# Patient Record
Sex: Female | Born: 1970 | Race: White | Hispanic: No | Marital: Married | Smoking: Never smoker
Health system: Southern US, Community
[De-identification: ages and names within clinical notes are randomized; demographics above are authoritative.]

## PROBLEM LIST (undated history)

## (undated) DIAGNOSIS — K219 Gastro-esophageal reflux disease without esophagitis: Secondary | ICD-10-CM

---

## 1998-12-07 ENCOUNTER — Other Ambulatory Visit: Admission: RE | Admit: 1998-12-07 | Discharge: 1998-12-07 | Payer: Self-pay | Admitting: Obstetrics and Gynecology

## 2000-02-25 ENCOUNTER — Other Ambulatory Visit: Admission: RE | Admit: 2000-02-25 | Discharge: 2000-02-25 | Payer: Self-pay | Admitting: Obstetrics and Gynecology

## 2001-05-14 ENCOUNTER — Other Ambulatory Visit: Admission: RE | Admit: 2001-05-14 | Discharge: 2001-05-14 | Payer: Self-pay | Admitting: Obstetrics and Gynecology

## 2002-07-06 ENCOUNTER — Other Ambulatory Visit: Admission: RE | Admit: 2002-07-06 | Discharge: 2002-07-06 | Payer: Self-pay | Admitting: Obstetrics and Gynecology

## 2003-08-16 ENCOUNTER — Other Ambulatory Visit: Admission: RE | Admit: 2003-08-16 | Discharge: 2003-08-16 | Payer: Self-pay | Admitting: Obstetrics and Gynecology

## 2003-08-22 ENCOUNTER — Encounter: Admission: RE | Admit: 2003-08-22 | Discharge: 2003-08-22 | Payer: Self-pay | Admitting: Obstetrics and Gynecology

## 2003-08-30 ENCOUNTER — Ambulatory Visit (HOSPITAL_BASED_OUTPATIENT_CLINIC_OR_DEPARTMENT_OTHER): Admission: RE | Admit: 2003-08-30 | Discharge: 2003-08-30 | Payer: Self-pay | Admitting: Urology

## 2004-10-26 ENCOUNTER — Ambulatory Visit: Payer: Self-pay | Admitting: Family Medicine

## 2007-03-18 ENCOUNTER — Ambulatory Visit (HOSPITAL_BASED_OUTPATIENT_CLINIC_OR_DEPARTMENT_OTHER): Admission: RE | Admit: 2007-03-18 | Discharge: 2007-03-18 | Payer: Self-pay | Admitting: Obstetrics and Gynecology

## 2008-03-01 ENCOUNTER — Ambulatory Visit: Payer: Self-pay | Admitting: Internal Medicine

## 2009-02-20 ENCOUNTER — Emergency Department: Payer: Self-pay | Admitting: Emergency Medicine

## 2010-02-09 LAB — HM MAMMOGRAPHY: HM Mammogram: ABNORMAL

## 2010-11-29 ENCOUNTER — Ambulatory Visit: Payer: Self-pay | Admitting: Obstetrics and Gynecology

## 2010-12-05 ENCOUNTER — Ambulatory Visit: Payer: Self-pay | Admitting: Obstetrics

## 2011-06-12 LAB — HM PAP SMEAR: HM Pap smear: NORMAL

## 2011-07-23 ENCOUNTER — Other Ambulatory Visit: Payer: Self-pay | Admitting: Internal Medicine

## 2011-07-24 MED ORDER — PANTOPRAZOLE SODIUM 40 MG PO TBEC: 40.0000 mg | DELAYED_RELEASE_TABLET | Freq: Every day | ORAL | Status: DC

## 2011-10-28 ENCOUNTER — Other Ambulatory Visit: Payer: Self-pay | Admitting: *Deleted

## 2011-10-28 NOTE — Telephone Encounter (Signed)
Faxed request from [facility], last filled 09/04/11.

## 2011-10-29 MED ORDER — CITALOPRAM HYDROBROMIDE 20 MG PO TABS: 20.0000 mg | ORAL_TABLET | Freq: Every day | ORAL | Status: DC

## 2011-12-19 ENCOUNTER — Other Ambulatory Visit: Payer: Self-pay | Admitting: Internal Medicine

## 2011-12-19 MED ORDER — PANTOPRAZOLE SODIUM 40 MG PO TBEC: 40.0000 mg | DELAYED_RELEASE_TABLET | Freq: Every day | ORAL | Status: DC

## 2012-11-11 ENCOUNTER — Ambulatory Visit (INDEPENDENT_AMBULATORY_CARE_PROVIDER_SITE_OTHER): Payer: BC Managed Care – PPO | Admitting: Internal Medicine

## 2012-11-11 ENCOUNTER — Encounter: Payer: Self-pay | Admitting: Internal Medicine

## 2012-11-11 VITALS — BP 120/86 | HR 81 | Temp 97.9°F | Resp 16 | Ht 67.0 in | Wt 235.0 lb

## 2012-11-11 DIAGNOSIS — E669 Obesity, unspecified: Secondary | ICD-10-CM

## 2012-11-11 DIAGNOSIS — R5381 Other malaise: Secondary | ICD-10-CM

## 2012-11-11 DIAGNOSIS — Z1239 Encounter for other screening for malignant neoplasm of breast: Secondary | ICD-10-CM

## 2012-11-11 DIAGNOSIS — R5383 Other fatigue: Secondary | ICD-10-CM

## 2012-11-11 DIAGNOSIS — B354 Tinea corporis: Secondary | ICD-10-CM | POA: Insufficient documentation

## 2012-11-11 DIAGNOSIS — Z1231 Encounter for screening mammogram for malignant neoplasm of breast: Secondary | ICD-10-CM

## 2012-11-11 MED ORDER — CITALOPRAM HYDROBROMIDE 10 MG PO TABS: 10.0000 mg | ORAL_TABLET | Freq: Every day | ORAL | Status: DC

## 2012-11-11 MED ORDER — PANTOPRAZOLE SODIUM 40 MG PO TBEC: 40.0000 mg | DELAYED_RELEASE_TABLET | Freq: Every day | ORAL | Status: DC

## 2012-11-11 NOTE — Assessment & Plan Note (Addendum)
Suggested by the recent appearance of 2 discrete annular lesion: Left thigh and left lower back.  No response to topicals  Used for 2 weeks.  Will check lfts and if normal will prescribe topical nystatin since she has already tried OTC topical terbinafine.

## 2012-11-11 NOTE — Patient Instructions (Addendum)
Schedule an early morning  fasting labs visit with the secretary  You need to lose 10%  Of your current body weight over the next 6 months   This is  my version of a  "Low GI"  Diet:  It is not ultra low carb, but will still lower your blood sugars and allow you to lose 5 to 10 lbs per month if you follow it carefully. All of the foods can be found at grocery stores and in bulk at [employer].  The Atkins protein bars and shakes are available in more varieties at [employer], [employer] and [employer].     7 AM Breakfast:  Low carbohydrate Protein  Shakes (I recommend the EAS AdvantEdge "Carb Control" shakes  Or the low carb shakes by Atkins.   Both are available everywhere:  In  cases at [employer]Js  Or in 4 packs at grocery stores and pharmacies  2.5 carbs  (Alternative is  a toasted Arnold's Sandwhich Thin w/ peanut butter, a "Bagel Thin" with cream cheese and salmon) or  a scrambled egg burrito made with a low carb tortilla .  Avoid cereal and bananas, oatmeal too unless you are cooking the old fashioned kind that takes 30-40 minutes to prepare.  the rest is overly processed, has minimal fiber, and is loaded with carbohydrates!   10 AM: Protein bar by Atkins (the snack size, under 200 cal).  There are many varieties , available widely again or in bulk in limited varieties at BJs)  Other so called "protein bars" tend to be loaded with carbohydrates.  Remember, in food advertising, the word "energy" is synonymous for " carbohydrate."  Lunch: sandwich of [place], (or any lunchmeat, grilled meat or canned tuna), fresh avocado, mayonnaise  and cheese on a lower carbohydrate pita bread, flatbread, or tortilla . Ok to use regular mayonnaise. The bread is the only source or carbohydrate that can be decreased (Joseph's makes a pita bread and a flat bread that are 50 cal and 4 net carbs ; Toufayan makes a low carb flatbread that's 100 cal and 9 net carbs  and  Mission makes a low carb whole wheat tortilla  That is 210 cal and  6 net carbs)  3 PM:  Mid day :  Another protein bar,  Or a  cheese stick (100 cal, 0 carbs),  Or 1 ounce of  almonds, walnuts, pistachios, pecans, peanuts,  Macadamia nuts. Or a Dannon light n Fit greek yogurt, 80 cal 8 net carbs . Avoid "granola"; the dried cranberries and raisins are loaded with carbohydrates. Mixed nuts ok if no raisins or cranberries or dried fruit.      6 PM  Dinner:  "mean and green:"  Meat/chicken/fish or a high protein legume; , with a green salad, and a low GI  Veggie (broccoli, cauliflower, green beans, spinach, brussel sprouts. Lima beans) : Avoid "Low fat dressings, as well as Reyne Dumas and [address]! They are loaded with sugar! Instead use ranch, vinagrette,  Blue cheese, etc.  There is a low carb pasta by Dreamfield's available at [facility] that is acceptable and tastes great. Try Michel Angel's chicken piccata over low carb pasta. The chicken dish is 0 carbs, and can be found in frozen section at BJs and [employer]'s. Also try [employer] "Carnitas" (pulled pork, no sauce,  0 carbs) and his pot roast.   both are in the refrigerated section at BJs   Dreamfield's makes a low carb pasta only 5  g/serving.  Available at all grocery stores,  And tastes like normal pasta  9 PM snack : Breyer's "low carb" fudgsicle or  ice cream bar (Carb Smart line), or  Weight Watcher's ice cream bar , or another "no sugar added" ice cream;a serving of fresh berries/cherries with whipped cream (Avoid bananas, pineapple, grapes  and watermelon on a regular basis because they are high in sugar)   Remember that snack Substitutions should be less than 10 carbs per serving and meals < 20 carbs. Remember to subtract fiber grams and sugar alcohols to get the "net carbs."

## 2012-11-11 NOTE — Progress Notes (Signed)
Patient ID: Kristin Davis, female   DOB: [birth year], 42 y.o.   MRN: [id number] Patient Active Problem List  Diagnosis  . Ringworm of body  . Obesity (BMI 30-39.9)    Subjective:  CC:   Chief Complaint  Patient presents with  . Annual Exam    HPI:   Kristin Davis is a 42 y.o. female who presents to establish primary care with the chief complaint of  Loss to followup.   She was last seen two years ago at my previous clinic,  Records are available. She has infrequent flares ups of GERD controlled with regular use of probiotics and prn H2 blockers. She has a history of GAD with panic attacks with no recent episodes.  Anxiety is controlled with celexa 10  Mg daily.  She has gained 24 lbs since she was last seen (July 2012) and is addressing her weight gain by ging to the gym several times per week at [employer]:    Cardio daily,  And circuit every other day .     History reviewed. No pertinent past medical history.  History reviewed. No pertinent past surgical history.  Family History  Problem Relation Age of Onset  . Heart attack Father   . Cancer Maternal Grandmother     ovarian    History   Social History  . Marital Status: Married    Spouse Name: N/A    Number of Children: N/A  . Years of Education: N/A   Occupational History  . Not on file.   Social History Main Topics  . Smoking status: Never Smoker   . Smokeless tobacco: Not on file  . Alcohol Use: No  . Drug Use: No  . Sexually Active:    Other Topics Concern  . Not on file   Social History Narrative  . No narrative on file   No Known Allergies   Review of Systems:  Patient denies headache, fevers, malaise, unintentional weight loss, skin rash, eye pain, sinus congestion and sinus pain, sore throat, dysphagia,  hemoptysis , cough, dyspnea, wheezing, chest pain, palpitations, orthopnea, edema, abdominal pain, nausea, melena, diarrhea, constipation, flank pain, dysuria, hematuria, urinary  Frequency,  nocturia, numbness, tingling, seizures,  Focal weakness, Loss of consciousness,  Tremor, insomnia, depression, anxiety, and suicidal ideation.    Objective:  BP 120/86  Pulse 81  Temp 97.9 F (36.6 C) (Oral)  Resp 16  Ht 5\' 7"  (1.702 m)  Wt 235 lb (106.595 kg)  BMI 36.81 kg/m2  SpO2 97%  LMP 10/28/2012  General appearance: alert, cooperative and appears stated age Ears: normal TM's and external ear canals both ears Throat: lips, mucosa, and tongue normal; teeth and gums normal Neck: no adenopathy, no carotid bruit, supple, symmetrical, trachea midline and thyroid not enlarged, symmetric, no tenderness/mass/nodules Back: symmetric, no curvature. ROM normal. No CVA tenderness. Lungs: clear to auscultation bilaterally Heart: regular rate and rhythm, S1, S2 normal, no murmur, click, rub or gallop Abdomen: soft, non-tender; bowel sounds normal; no masses,  no organomegaly Pulses: 2+ and symmetric Skin: 2 discrete annular papular lesions, erythematous and scaling.  Inner left thing and left lower back  Lymph nodes: Cervical, supraclavicular, and axillary nodes normal.  Assessment and Plan:  Ringworm of body Suggested by the recent appearance of 2 discrete annular lesion: Left thigh and left lower back.  No response to topicals  Used for 2 weeks.  Will check lfts and if normal will prescribe topical nystatin since she has already tried OTC topical  terbinafine.   Obesity (BMI 30-39.9) I have addressed  BMI and recommended a low glycemic index diet utilizing smaller more frequent meals to increase metabolism.  I have also recommended that patient start exercising with a goal of 30 minutes of aerobic exercise a minimum of 5 days per week. Screening for lipid disorders, thyroid and diabetes was done  today.   A total of 60 minutes was spent reviewing patient's history, medical records, and in exam.  Over 50% of which was face to face time.   Updated Medication List Outpatient Encounter  Prescriptions as of 11/11/2012  Medication Sig Dispense Refill  . citalopram (CELEXA) 10 MG tablet Take 1 tablet (10 mg total) by mouth daily.  90 tablet  3  . pantoprazole (PROTONIX) 40 MG tablet Take 1 tablet (40 mg total) by mouth daily.  90 tablet  3  . [DISCONTINUED] citalopram (CELEXA) 20 MG tablet Take 10 mg by mouth daily.       . [DISCONTINUED] pantoprazole (PROTONIX) 40 MG tablet Take 1 tablet (40 mg total) by mouth daily.  30 tablet  3  . nystatin cream (MYCOSTATIN) Apply topically 2 (two) times daily.  30 g  0  . [DISCONTINUED] citalopram (CELEXA) 20 MG tablet Take 10 mg by mouth daily.         Orders Placed This Encounter  Procedures  . HM MAMMOGRAPHY  . MM Digital Screening  . HM PAP SMEAR  . CBC with Differential  . Comprehensive metabolic panel  . TSH  . Lipid panel    No Follow-up on file.

## 2012-11-12 ENCOUNTER — Other Ambulatory Visit (INDEPENDENT_AMBULATORY_CARE_PROVIDER_SITE_OTHER): Payer: BC Managed Care – PPO

## 2012-11-12 DIAGNOSIS — R5383 Other fatigue: Secondary | ICD-10-CM

## 2012-11-12 DIAGNOSIS — E669 Obesity, unspecified: Secondary | ICD-10-CM

## 2012-11-12 DIAGNOSIS — R5381 Other malaise: Secondary | ICD-10-CM

## 2012-11-12 LAB — CBC WITH DIFFERENTIAL/PLATELET
Basophils Absolute: 0 10*3/uL (ref 0.0–0.1)
Basophils Relative: 0.7 % (ref 0.0–3.0)
Eosinophils Absolute: 0.1 10*3/uL (ref 0.0–0.7)
Eosinophils Relative: 1.1 % (ref 0.0–5.0)
HCT: 38 % (ref 36.0–46.0)
Hemoglobin: 12.9 g/dL (ref 12.0–15.0)
Lymphocytes Relative: 31.1 % (ref 12.0–46.0)
Lymphs Abs: 1.6 10*3/uL (ref 0.7–4.0)
MCHC: 34.1 g/dL (ref 30.0–36.0)
MCV: 87.8 fl (ref 78.0–100.0)
Monocytes Absolute: 0.6 10*3/uL (ref 0.1–1.0)
Monocytes Relative: 12 % (ref 3.0–12.0)
Neutro Abs: 2.9 10*3/uL (ref 1.4–7.7)
Neutrophils Relative %: 55.1 % (ref 43.0–77.0)
Platelets: 200 10*3/uL (ref 150.0–400.0)
RBC: 4.33 Mil/uL (ref 3.87–5.11)
RDW: 14.6 % (ref 11.5–14.6)
WBC: 5.2 10*3/uL (ref 4.5–10.5)

## 2012-11-12 LAB — COMPREHENSIVE METABOLIC PANEL
ALT: 29 U/L (ref 0–35)
AST: 27 U/L (ref 0–37)
Albumin: 3.8 g/dL (ref 3.5–5.2)
Alkaline Phosphatase: 48 U/L (ref 39–117)
BUN: 13 mg/dL (ref 6–23)
CO2: 22 mEq/L (ref 19–32)
Calcium: 8.8 mg/dL (ref 8.4–10.5)
Chloride: 107 mEq/L (ref 96–112)
Creatinine, Ser: 0.7 mg/dL (ref 0.4–1.2)
GFR: 96.71 mL/min (ref >60.00)
Glucose, Bld: 97 mg/dL (ref 70–99)
Potassium: 3.9 mEq/L (ref 3.5–5.1)
Sodium: 136 mEq/L (ref 135–145)
Total Bilirubin: 0.6 mg/dL (ref 0.3–1.2)
Total Protein: 7 g/dL (ref 6.0–8.3)

## 2012-11-12 LAB — LDL CHOLESTEROL, DIRECT: Direct LDL: 141.8 mg/dL

## 2012-11-12 LAB — LIPID PANEL
Cholesterol: 211 mg/dL — ABNORMAL HIGH (ref 0–200)
HDL: 35.2 mg/dL — ABNORMAL LOW (ref >39.00)
Total CHOL/HDL Ratio: 6
Triglycerides: 153 mg/dL — ABNORMAL HIGH (ref 0.0–149.0)
VLDL: 30.6 mg/dL (ref 0.0–40.0)

## 2012-11-12 LAB — TSH: TSH: 2.55 u[IU]/mL (ref 0.35–5.50)

## 2012-11-13 DIAGNOSIS — E669 Obesity, unspecified: Secondary | ICD-10-CM | POA: Insufficient documentation

## 2012-11-13 MED ORDER — NYSTATIN 100000 UNIT/GM EX CREA: TOPICAL_CREAM | Freq: Two times a day (BID) | CUTANEOUS | Status: DC

## 2012-11-13 NOTE — Assessment & Plan Note (Signed)
I have addressed  BMI and recommended a low glycemic index diet utilizing smaller more frequent meals to increase metabolism.  I have also recommended that patient start exercising with a goal of 30 minutes of aerobic exercise a minimum of 5 days per week. Screening for lipid disorders, thyroid and diabetes was  done today.   

## 2012-11-16 ENCOUNTER — Other Ambulatory Visit: Payer: BC Managed Care – PPO

## 2012-11-23 ENCOUNTER — Encounter: Payer: Self-pay | Admitting: Internal Medicine

## 2012-11-23 DIAGNOSIS — D229 Melanocytic nevi, unspecified: Secondary | ICD-10-CM

## 2012-11-23 HISTORY — DX: Melanocytic nevi, unspecified: D22.9

## 2013-09-21 ENCOUNTER — Ambulatory Visit
Admission: RE | Admit: 2013-09-21 | Discharge: 2013-09-21 | Disposition: A | Discharge status: Home / Self Care | Payer: BC Managed Care – PPO | Source: Ambulatory Visit | Attending: Internal Medicine | Admitting: Internal Medicine

## 2013-09-21 DIAGNOSIS — Z1231 Encounter for screening mammogram for malignant neoplasm of breast: Secondary | ICD-10-CM

## 2013-09-23 ENCOUNTER — Other Ambulatory Visit: Payer: Self-pay | Admitting: Internal Medicine

## 2013-09-23 DIAGNOSIS — R928 Other abnormal and inconclusive findings on diagnostic imaging of breast: Secondary | ICD-10-CM

## 2013-09-29 ENCOUNTER — Ambulatory Visit
Admission: RE | Admit: 2013-09-29 | Discharge: 2013-09-29 | Disposition: A | Discharge status: Home / Self Care | Payer: BC Managed Care – PPO | Source: Ambulatory Visit | Attending: Internal Medicine | Admitting: Internal Medicine

## 2013-09-29 ENCOUNTER — Other Ambulatory Visit: Payer: Self-pay | Admitting: Internal Medicine

## 2013-09-29 DIAGNOSIS — R928 Other abnormal and inconclusive findings on diagnostic imaging of breast: Secondary | ICD-10-CM

## 2013-09-30 ENCOUNTER — Ambulatory Visit
Admission: RE | Admit: 2013-09-30 | Discharge: 2013-09-30 | Disposition: A | Discharge status: Home / Self Care | Payer: BC Managed Care – PPO | Source: Ambulatory Visit | Attending: Internal Medicine | Admitting: Internal Medicine

## 2013-09-30 ENCOUNTER — Other Ambulatory Visit: Payer: Self-pay | Admitting: Internal Medicine

## 2013-09-30 DIAGNOSIS — R928 Other abnormal and inconclusive findings on diagnostic imaging of breast: Secondary | ICD-10-CM

## 2013-09-30 DIAGNOSIS — Z87898 Personal history of other specified conditions: Secondary | ICD-10-CM

## 2013-09-30 HISTORY — PX: BREAST BIOPSY: SHX20

## 2013-10-03 DIAGNOSIS — Z87898 Personal history of other specified conditions: Secondary | ICD-10-CM | POA: Insufficient documentation

## 2013-10-21 HISTORY — PX: ABDOMINAL HYSTERECTOMY: SHX81

## 2013-12-22 ENCOUNTER — Ambulatory Visit: Payer: Self-pay | Admitting: Obstetrics and Gynecology

## 2013-12-22 LAB — CBC
HCT: 32.8 % — ABNORMAL LOW (ref 35.0–47.0)
HGB: 10.4 g/dL — ABNORMAL LOW (ref 12.0–16.0)
MCH: 27.4 pg (ref 26.0–34.0)
MCHC: 31.7 g/dL — ABNORMAL LOW (ref 32.0–36.0)
MCV: 87 fL (ref 80–100)
Platelet: 274 10*3/uL (ref 150–440)
RBC: 3.8 10*6/uL (ref 3.80–5.20)
RDW: 14.5 % (ref 11.5–14.5)
WBC: 7.3 10*3/uL (ref 3.6–11.0)

## 2013-12-22 LAB — PREGNANCY, URINE: Pregnancy Test, Urine: NEGATIVE m[IU]/mL

## 2013-12-27 ENCOUNTER — Ambulatory Visit: Payer: Self-pay | Admitting: Obstetrics and Gynecology

## 2013-12-28 LAB — HEMOGLOBIN: HGB: 8.2 g/dL — ABNORMAL LOW (ref 12.0–16.0)

## 2013-12-29 LAB — PATHOLOGY REPORT

## 2014-04-12 ENCOUNTER — Encounter: Payer: Self-pay | Admitting: Internal Medicine

## 2014-04-12 ENCOUNTER — Ambulatory Visit (INDEPENDENT_AMBULATORY_CARE_PROVIDER_SITE_OTHER): Payer: BC Managed Care – PPO | Admitting: Internal Medicine

## 2014-04-12 VITALS — BP 112/74 | HR 84 | Temp 99.0°F | Resp 18 | Ht 67.0 in | Wt 231.5 lb

## 2014-04-12 DIAGNOSIS — R0683 Snoring: Secondary | ICD-10-CM

## 2014-04-12 DIAGNOSIS — E669 Obesity, unspecified: Secondary | ICD-10-CM

## 2014-04-12 DIAGNOSIS — R635 Abnormal weight gain: Secondary | ICD-10-CM

## 2014-04-12 DIAGNOSIS — M546 Pain in thoracic spine: Secondary | ICD-10-CM

## 2014-04-12 DIAGNOSIS — R0609 Other forms of dyspnea: Secondary | ICD-10-CM

## 2014-04-12 DIAGNOSIS — R0989 Other specified symptoms and signs involving the circulatory and respiratory systems: Secondary | ICD-10-CM

## 2014-04-12 MED ORDER — ZOLPIDEM TARTRATE ER 6.25 MG PO TBCR
6.2500 mg | EXTENDED_RELEASE_TABLET | Freq: Every evening | ORAL | Status: DC | PRN
Start: 2014-04-12 — End: 2014-08-17

## 2014-04-12 NOTE — Patient Instructions (Signed)
You have no evidence of an aortic aneurysm by review of your prior abdominal CT in 2004.  Without a history fo hypertension and tobacco abuse, it is VERY unlikely that you would have developed one since then  Your back pain may actually be coming from a gallbladder issue. If the pain happens after eating,  We will order an evaluation of your GB  Sleep study to be scheduled.  Take the ambien cr with you (2 tablets, just in case)

## 2014-04-12 NOTE — Progress Notes (Signed)
Patient ID: Kristin Davis, female   DOB: 09/30/1971, 43 y.o.   MRN: [id number]  Patient Active Problem List   Diagnosis Date Noted  . Back pain, thoracic 04/14/2014  . History of abnormal mammogram 10/03/2013  . Obesity (BMI 30-39.9) 11/13/2012  . Ringworm of body 11/11/2012    Subjective:  CC:   Chief Complaint  Patient presents with  . Back Pain    between shoulders/ patient concerned of family HX of aortic aneurysm    HPI:   Kristin Davis is a 43 y.o. female who presents for Recurrent Chest and back pain for about 6 months. Location is between the shoulder blades. . Above bra 2-3 inches.  Episodic ,  Feels like she was struck in the back.  Father was recently diagnosed with an abdominal aortic aneurysm.   Takes prilosec otc prn gerd., prior use of protonix daily stopped.   Had her hysterectomy /BSO by [facility] for menorrhagia,  Family history of ovarian Ca   having trouble staying asleep about once a week.  History of  snoring    History reviewed. No pertinent past medical history.  Past Surgical History  Procedure Laterality Date  . Abdominal hysterectomy  2015       The following portions of the patient's history were reviewed and updated as appropriate: Allergies, current medications, and problem list.    Review of Systems:   Patient denies headache, fevers, malaise, unintentional weight loss, skin rash, eye pain, sinus congestion and sinus pain, sore throat, dysphagia,  hemoptysis , cough, dyspnea, wheezing, chest pain, palpitations, orthopnea, edema, abdominal pain, nausea, melena, diarrhea, constipation, flank pain, dysuria, hematuria, urinary  Frequency, nocturia, numbness, tingling, seizures,  Focal weakness, Loss of consciousness,  Tremor, insomnia, depression, anxiety, and suicidal ideation.     History   Social History  . Marital Status: Married    Spouse Name: N/A    Number of Children: N/A  . Years of Education: N/A   Occupational History   . Not on file.   Social History Main Topics  . Smoking status: Never Smoker   . Smokeless tobacco: Not on file  . Alcohol Use: No  . Drug Use: No  . Sexual Activity:    Other Topics Concern  . Not on file   Social History Narrative  . No narrative on file    Objective:  Filed Vitals:   04/12/14 1614  BP: 112/74  Pulse: 84  Temp: 99 F (37.2 C)  Resp: 18     General appearance: alert, cooperative and appears stated age Ears: normal TM's and external ear canals both ears Throat: lips, mucosa, and tongue normal; teeth and gums normal Neck: no adenopathy, no carotid bruit, supple, symmetrical, trachea midline and thyroid not enlarged, symmetric, no tenderness/mass/nodules Back: symmetric, no curvature. ROM normal. No CVA tenderness. Lungs: clear to auscultation bilaterally Heart: regular rate and rhythm, S1, S2 normal, no murmur, click, rub or gallop Abdomen: soft, non-tender; bowel sounds normal; no masses,  no organomegaly Pulses: 2+ and symmetric Skin: Skin color, texture, turgor normal. No rashes or lesions Lymph nodes: Cervical, supraclavicular, and axillary nodes normal.  Assessment and Plan:  Obesity (BMI 30-39.9) I have addressed  BMI and recommended a low glycemic index diet utilizing smaller more frequent meals to increase metabolism.  I have also recommended that patient start exercising with a goal of 30 minutes of aerobic exercise a minimum of 5 days per week. Screening for lipid disorders, thyroid and diabetes to be  done today.    Back pain, thoracic Suggested GB disease as a cause of her recurrent back pain, not an aneursym. Prior CT from 5 yrs ago reviewed. Advised to consider an ultrasound  Primary snoring Obesity, snoring and insomnia.  Sleep study ordered.   Updated Medication List Outpatient Encounter Prescriptions as of 04/12/2014  Medication Sig  . nystatin cream (MYCOSTATIN) Apply topically 2 (two) times daily.  . pantoprazole (PROTONIX)  40 MG tablet Take 1 tablet (40 mg total) by mouth daily.  Marland Kitchen zolpidem (AMBIEN CR) 6.25 MG CR tablet Take 1 tablet (6.25 mg total) by mouth at bedtime as needed for sleep.  . [DISCONTINUED] citalopram (CELEXA) 10 MG tablet Take 1 tablet (10 mg total) by mouth daily.

## 2014-04-12 NOTE — Progress Notes (Signed)
Pre-visit discussion using our clinic review tool. No additional management support is needed unless otherwise documented below in the visit note.  

## 2014-04-13 LAB — COMPREHENSIVE METABOLIC PANEL
ALT: 31 U/L (ref 0–35)
AST: 29 U/L (ref 0–37)
Albumin: 4.2 g/dL (ref 3.5–5.2)
Alkaline Phosphatase: 68 U/L (ref 39–117)
BUN: 16 mg/dL (ref 6–23)
CO2: 24 mEq/L (ref 19–32)
Calcium: 9 mg/dL (ref 8.4–10.5)
Chloride: 107 mEq/L (ref 96–112)
Creatinine, Ser: 0.9 mg/dL (ref 0.4–1.2)
GFR: 68.38 mL/min (ref >60.00)
Glucose, Bld: 85 mg/dL (ref 70–99)
Potassium: 4.1 mEq/L (ref 3.5–5.1)
Sodium: 139 mEq/L (ref 135–145)
Total Bilirubin: 0.3 mg/dL (ref 0.2–1.2)
Total Protein: 7.4 g/dL (ref 6.0–8.3)

## 2014-04-13 LAB — TSH: TSH: 0.32 u[IU]/mL — ABNORMAL LOW (ref 0.35–4.50)

## 2014-04-14 ENCOUNTER — Encounter: Payer: Self-pay | Admitting: Internal Medicine

## 2014-04-14 ENCOUNTER — Ambulatory Visit: Payer: BC Managed Care – PPO

## 2014-04-14 DIAGNOSIS — R7309 Other abnormal glucose: Secondary | ICD-10-CM

## 2014-04-14 DIAGNOSIS — M546 Pain in thoracic spine: Secondary | ICD-10-CM | POA: Insufficient documentation

## 2014-04-14 DIAGNOSIS — R0683 Snoring: Secondary | ICD-10-CM | POA: Insufficient documentation

## 2014-04-14 LAB — T4, FREE: Free T4: 1.02 ng/dL (ref 0.60–1.60)

## 2014-04-14 NOTE — Assessment & Plan Note (Signed)
Suggested GB disease as a cause of her recurrent back pain, not an aneursym. Prior CT from 5 yrs ago reviewed. Advised to consider an ultrasound

## 2014-04-14 NOTE — Assessment & Plan Note (Signed)
Obesity, snoring and insomnia.  Sleep study ordered.

## 2014-04-14 NOTE — Assessment & Plan Note (Signed)
I have addressed  BMI and recommended a low glycemic index diet utilizing smaller more frequent meals to increase metabolism.  I have also recommended that patient start exercising with a goal of 30 minutes of aerobic exercise a minimum of 5 days per week. Screening for lipid disorders, thyroid and diabetes to be done today.   

## 2014-05-27 ENCOUNTER — Telehealth: Payer: Self-pay | Admitting: *Deleted

## 2014-05-27 DIAGNOSIS — E039 Hypothyroidism, unspecified: Secondary | ICD-10-CM

## 2014-05-27 NOTE — Telephone Encounter (Signed)
Pt is coming in on Monday what labs and dx?

## 2014-05-30 ENCOUNTER — Other Ambulatory Visit (INDEPENDENT_AMBULATORY_CARE_PROVIDER_SITE_OTHER): Payer: BC Managed Care – PPO

## 2014-05-30 DIAGNOSIS — E0590 Thyrotoxicosis, unspecified without thyrotoxic crisis or storm: Secondary | ICD-10-CM

## 2014-05-30 DIAGNOSIS — E039 Hypothyroidism, unspecified: Secondary | ICD-10-CM

## 2014-05-31 DIAGNOSIS — E0590 Thyrotoxicosis, unspecified without thyrotoxic crisis or storm: Secondary | ICD-10-CM | POA: Insufficient documentation

## 2014-05-31 LAB — T4 AND TSH
T4, Total: 11.1 ug/dL (ref 4.5–12.0)
TSH: 0.009 u[IU]/mL — ABNORMAL LOW (ref 0.450–4.500)

## 2014-05-31 NOTE — Addendum Note (Signed)
Addended by: Crecencio Mc on: 05/31/2014 04:57 PM   Modules accepted: Orders

## 2014-06-08 NOTE — Addendum Note (Signed)
Addended by: Crecencio Mc on: 06/08/2014 01:12 PM   Modules accepted: Orders

## 2014-06-09 ENCOUNTER — Ambulatory Visit: Payer: BC Managed Care – PPO | Admitting: Endocrinology

## 2014-06-14 ENCOUNTER — Encounter: Payer: Self-pay | Admitting: Endocrinology

## 2014-06-14 ENCOUNTER — Ambulatory Visit (INDEPENDENT_AMBULATORY_CARE_PROVIDER_SITE_OTHER): Payer: BC Managed Care – PPO | Admitting: Endocrinology

## 2014-06-14 VITALS — BP 118/78 | HR 66 | Temp 98.3°F | Resp 16 | Ht 67.0 in | Wt 216.2 lb

## 2014-06-14 DIAGNOSIS — E0590 Thyrotoxicosis, unspecified without thyrotoxic crisis or storm: Secondary | ICD-10-CM

## 2014-06-14 DIAGNOSIS — E041 Nontoxic single thyroid nodule: Secondary | ICD-10-CM

## 2014-06-14 LAB — HEPATIC FUNCTION PANEL
ALT: 35 U/L (ref 0–35)
AST: 29 U/L (ref 0–37)
Albumin: 3.8 g/dL (ref 3.5–5.2)
Alkaline Phosphatase: 42 U/L (ref 39–117)
Bilirubin, Direct: 0.1 mg/dL (ref 0.0–0.3)
Total Bilirubin: 0.5 mg/dL (ref 0.2–1.2)
Total Protein: 7.2 g/dL (ref 6.0–8.3)

## 2014-06-14 LAB — T3, FREE: T3, Free: 2.3 pg/mL (ref 2.3–4.2)

## 2014-06-14 LAB — T4, FREE: Free T4: 0.56 ng/dL — ABNORMAL LOW (ref 0.60–1.60)

## 2014-06-14 NOTE — Progress Notes (Signed)
Reason for visit: Low TSH/hyperthyroidism HPI  Kristin Davis is a 43 y.o.-year-old female, referred by her PCP,  Deborra Medina, MD, for evaluation for hyperthyroidism/low TSH.   The patient denies any prior personal history of thyroid problems.  Mom has hypothyroidism. she denies any personal XRT exposure to her neck area. No recent hospitalizations or illness. Did have a couple of episodes of Sharp pain in neck and thyroid area the week prior, but denies any soreness that is persistent. Has been stressed lately as daughter is off to [employer].  Denies any recent ingestion of seaweed/kelp or recent exposure to iv contrast dye. No recent steroid use or use of herbal supplements.  Had a hysterectomy for menorrhagia in march 2015. History of ovarian cancer in family, so had bilateral ovarianectomy as well. Not on hormones.  Reports being symptomatic 1 month after her surgery (~ April 2015) with hair loss, On and off hot flashes and fatigue. Reports being anemic at time of discharge. Now that HB is better, her energy levels are better but has above symptoms- is considering starting HRT.  Started back on Lifestyle changes this Summer. Lost 20 lbs since July through [employer] and exercise. Hasn't lost any more since past 2 weeks. Has been more hungry lately.   Also, wonders if her facial hair is related to her thyroid. Has noted increased facial hair since puberty. Was tested several years ago for PCOS, but testing came back okay. Asked at that time to start BCPs, which she didn't want to pursue. Now shaving daily for facial hair. Reports that cortisol testing at that time with 24 hour urine was mildy abnormal. Periods were regular prior to recent uterine surgery. No breast discharge. Has been having prominent facial hair since puberty. No recent steroids or abnormal stretch marks or easy bruising.      I reviewed pt's thyroid tests: Lab Results  Component Value Date   TSH 0.009* 05/30/2014   TSH 0.32* 04/12/2014   TSH 2.55 11/12/2012   FREET4 1.02 04/14/2014    Other pertinent labs are as follows: Lab Results  Component Value Date   AST 29 04/12/2014   Lab Results  Component Value Date   ALT 31 04/12/2014    Review of systems:  [ x ] complains of   [  ] denies  [ x ] weight loss [  ]  tremors [  ] palpitations  [  ] diarrhea [  ] increased anxiety [  ] muscle weakness  [ x ] heat intolerance [ x ] fatigue  2weeks ago and then better [  ] proptosis [  ] problems with eye closure or color vision ( had symptoms of dry right eye at night a few weeks ago, but this is better). [  ] noticing any enlargement in size of thyroid [  ] lumps in neck [  ] dysphagia  [  ] change in voice [  ]  SOB when lying down   I have reviewed the patient's past medical history, family and social history, surgical history, medications and allergies.  No past medical history on file. Past Surgical History  Procedure Laterality Date  . Abdominal hysterectomy  2015   History   Social History  . Marital Status: Married    Spouse Name: N/A    Number of Children: N/A  . Years of Education: N/A   Occupational History  . Not on file.   Social History Main Topics  . Smoking  status: Never Smoker   . Smokeless tobacco: Not on file  . Alcohol Use: No  . Drug Use: No  . Sexual Activity: Not on file   Other Topics Concern  . Not on file   Social History Narrative  . No narrative on file   Current Outpatient Prescriptions on File Prior to Visit  Medication Sig Dispense Refill  . nystatin cream (MYCOSTATIN) Apply topically 2 (two) times daily.  30 g  0  . pantoprazole (PROTONIX) 40 MG tablet Take 1 tablet (40 mg total) by mouth daily.  90 tablet  3  . zolpidem (AMBIEN CR) 6.25 MG CR tablet Take 1 tablet (6.25 mg total) by mouth at bedtime as needed for sleep.  30 tablet  0   No current facility-administered medications on file prior to visit.   No Known Allergies Family History  Problem  Relation Age of Onset  . Heart attack Father   . Alcohol abuse Father   . Aortic aneurysm Father   . Cancer Maternal Grandmother     ovarian  . Thyroid disease Mother      ROS: Review of Systems: [x]  complains of  [  ] denies General:   [ x ] Recent weight change [x  ] Fatigue  [  ] Loss of appetite Eyes: [  ]  Vision Difficulty [ x ]  Eye pain ENT: [  ]  Hearing difficulty [ x ]  Difficulty Swallowing CVS: [  ] Chest pain [  ]  Palpitations/Irregular Heart beat [  ]  Shortness of breath lying flat [  ] Swelling of legs Resp: [  ] Frequent Cough [  ] Shortness of Breath  [  ]  Wheezing GI: [  ] Heartburn  [  ] Nausea or Vomiting  [  ] Diarrhea [  ] Constipation  [  ] Abdominal Pain GU: [  ]  Polyuria  [  ]  nocturia Bones/joints:  [  ]  Muscle aches  [  ] Joint Pain  [  ] Bone pain Skin/Hair/Nails: [  ]  Rash  [  ] New stretch marks [ x ]  Itching [ x ] Hair loss [ x ]  Excessive hair growth Reproduction: [x  ] Low sexual desire , [  ]  Women: Menstrual cycle problems [  ]  Women: Breast Discharge [  ] Men: Difficulty with erections [  ]  Men: Enlarged Breasts CNS: [  ] Frequent Headaches [  ] Blurry vision [  ] Tremors [  ] Seizures [  ] Loss of consciousness [  ] Localized weakness Endocrine: [  ]  Excess thirst [ x ]  Feeling excessively hot [  ]  Feeling excessively cold Heme: [  ]  Easy bruising [  ]  Enlarged glands or lumps in neck Allergy: [  ]  Food allergies [ x ] Environmental allergies  PE: BP 118/78  Pulse 66  Temp(Src) 98.3 F (36.8 C) (Oral)  Resp 16  Ht 5\' 7"  (1.702 m)  Wt 216 lb 4 oz (98.09 kg)  BMI 33.86 kg/m2  SpO2 98%  LMP 10/28/2012 Wt Readings from Last 3 Encounters:  06/14/14 216 lb 4 oz (98.09 kg)  04/12/14 231 lb 8 oz (105.008 kg)  11/11/12 235 lb (106.595 kg)    HEENT: [place]/AT, EOMI, no icterus, no proptosis, no chemosis, no mild lid lag, no retraction, eyes close completely, no overt hirsuitism Neck: thyroid gland - smooth  but with possible left  lower pole nodularity, non-tender, no erythema, no tracheal deviation; negative Pemberton's sign; no lymphadenopathy; no bruits Lungs: good air entry, clear bilaterally Heart: S1&S2 normal, regular rate & rhythm; no murmurs, rubs or gallops Abd: soft, NT, ND, no HSM, +BS, no abnormal straie Ext: mild tremor in hands bilaterally, no edema, 2+ DP/PT pulses, good muscle mass Neuro: normal gait, 2+ reflexes bilaterally, normal 5/5 strength, no proximal myopathy  Derm: no pretibial myxoedema/skin dryness   ASSESSMENT: 1. Low TSH/Hyperthyroidism  2. Left thyroid nodule.   PLAN:   Problem List Items Addressed This Visit     Endocrine   Hyperthyroidism - Primary     Discussed with the patient regarding thyroid hormone physiology, symptoms and signs of hyperthyroidism. Discussed possible etiologies for low TSH could indicate early subclinical hyperthyroidism due to Graves disease versus toxic adenoma or toxic MNG versus transient changes due to thyroiditis.   She appears to be clinically euthyroid today, with well controlled HR. Will hold off on starting Beta blockers at this time.  Recheck thyroid labs with thyroid antibody levels to assess etiology of hyperthyroidism.  Will obtain thyroid US due to abnormal exam and possible left thyroid nodule.  She may need a diagnostic nuclear scan as the next step in evaluation of her symptoms.  Discussed possible treatment options if persistent/worsening hyperthyroidism is noted.  RTC 2 months.     Relevant Orders      Thyroid Profile II      Thyroid stimulating immunoglobulin      T3, free (Completed)      T4, free (Completed)      Hepatic function panel (Completed)      US Soft Tissue Head/Neck    Other Visit Diagnoses   Left thyroid nodule        Ordered Thyroid US for further evaluation.     Relevant Orders       US Soft Tissue Head/Neck      Patient wants to defer further workup of reported remote elevation of cortisol level at this  time. No overt hirsuitism. No overt signs of Cushing's. Don't feel that this is related to her hyperthyroidism at this time.   [facility] 06/14/2014  9:49 AM

## 2014-06-14 NOTE — Progress Notes (Signed)
Pre-visit discussion using our clinic review tool. No additional management support is needed unless otherwise documented below in the visit note.  

## 2014-06-14 NOTE — Assessment & Plan Note (Signed)
Discussed with the patient regarding thyroid hormone physiology, symptoms and signs of hyperthyroidism. Discussed possible etiologies for low TSH could indicate early subclinical hyperthyroidism due to Graves disease versus toxic adenoma or toxic MNG versus transient changes due to thyroiditis.   She appears to be clinically euthyroid today, with well controlled HR. Will hold off on starting Beta blockers at this time.  Recheck thyroid labs with thyroid antibody levels to assess etiology of hyperthyroidism.  Will obtain thyroid US due to abnormal exam and possible left thyroid nodule.  She may need a diagnostic nuclear scan as the next step in evaluation of her symptoms.  Discussed possible treatment options if persistent/worsening hyperthyroidism is noted.  RTC 2 months.

## 2014-06-14 NOTE — Patient Instructions (Addendum)
Monitor for symptoms of hyperthyroidism- weight loss, diarrhea, tremors, palpitations  Labs today.  Will order thyroid Ultrasound to evaluate possible left thyroid nodule.   Please come back for a follow-up appointment in 2 months

## 2014-06-15 ENCOUNTER — Encounter: Payer: Self-pay | Admitting: *Deleted

## 2014-06-15 LAB — THYROID PROFILE II
Free Thyroxine Index: 1.1 — ABNORMAL LOW (ref 1.2–4.9)
T3 Uptake Ratio: 24 % (ref 24–39)
T3, Total: 104 ng/dL (ref 71–180)
T4, Total: 4.5 ug/dL (ref 4.5–12.0)
TSH: 0.654 u[IU]/mL (ref 0.450–4.500)

## 2014-06-18 LAB — THYROID STIMULATING IMMUNOGLOBULIN: TSI: 28 % baseline (ref <140)

## 2014-06-20 ENCOUNTER — Telehealth: Payer: Self-pay | Admitting: Internal Medicine

## 2014-06-20 NOTE — Telephone Encounter (Signed)
Patient stated she has been reading on hyperthyroidism and was concerned to know if she may have labs to check for celiac disease? Please advise.

## 2014-06-20 NOTE — Telephone Encounter (Signed)
No, I don't order tests based on patient's reading.  i will need to see her to  decide if the tests need to be done

## 2014-06-21 ENCOUNTER — Encounter: Payer: Self-pay | Admitting: *Deleted

## 2014-06-22 NOTE — Telephone Encounter (Signed)
Left message, notifying patient to schedule appt to discuss

## 2014-06-23 ENCOUNTER — Ambulatory Visit: Payer: Self-pay | Admitting: Endocrinology

## 2014-08-17 ENCOUNTER — Encounter: Payer: Self-pay | Admitting: Endocrinology

## 2014-08-17 ENCOUNTER — Ambulatory Visit (INDEPENDENT_AMBULATORY_CARE_PROVIDER_SITE_OTHER): Payer: BC Managed Care – PPO | Admitting: Endocrinology

## 2014-08-17 ENCOUNTER — Ambulatory Visit: Payer: BC Managed Care – PPO | Admitting: Internal Medicine

## 2014-08-17 VITALS — BP 120/74 | HR 80 | Wt 211.5 lb

## 2014-08-17 DIAGNOSIS — E0590 Thyrotoxicosis, unspecified without thyrotoxic crisis or storm: Secondary | ICD-10-CM

## 2014-08-17 NOTE — Progress Notes (Signed)
Reason for visit: Low TSH/hyperthyroidism HPI  Kristin Davis is a 43 y.o.-year-old female,  Here for follow up of hyperthyroidism/low TSH. Last seen August 2015.    The patient denies any prior personal history of thyroid problems.  Mom has hypothyroidism. she denies any personal XRT exposure to her neck area. No recent hospitalizations or illness. Did have a couple of episodes of Sharp pain in neck and thyroid area August 2015, but denies any soreness that is persistent. Had been stressed lately as daughter is off to [employer].  Denies any recent ingestion of seaweed/kelp or recent exposure to iv contrast dye. No recent steroid use or use of herbal supplements.  Had a hysterectomy for menorrhagia in march 2015. History of ovarian cancer in family, so had bilateral ovarianectomy as well. Not on hormones.  Reports being symptomatic 1 month after her surgery (~ April 2015) with hair loss, On and off hot flashes and fatigue. Reports being anemic at time of discharge. Now that HB is better, her energy levels are better but has above symptoms- is considering starting HRT.  Started back on Lifestyle changes this Summer. Has been losing weight through [employer] and exercise.   Also, wonders if her facial hair is related to her thyroid. Has noted increased facial hair since puberty. Was tested several years ago for PCOS, but testing came back okay. Asked at that time to start BCPs, which she didn't want to pursue. Now shaving daily for facial hair. Reports that cortisol testing at that time with 24 hour urine was mildy abnormal. Periods were regular prior to recent uterine surgery. No breast discharge. Has been having prominent facial hair since puberty. No recent steroids or abnormal stretch marks or easy bruising.      I reviewed pt's thyroid tests: Lab Results  Component Value Date   TSH 0.654 06/14/2014   TSH 0.009* 05/30/2014   TSH 0.32* 04/12/2014   TSH 2.55 11/12/2012   FREET4 0.56*  06/14/2014   FREET4 1.02 04/14/2014    Other pertinent labs are as follows: Lab Results  Component Value Date   AST 29 06/14/2014   Lab Results  Component Value Date   ALT 35 06/14/2014   TSI levels normal August 2015   Review of systems:  [ x ] complains of   [  ] denies  [ x ] weight loss-intentional [  ]  tremors [  ] palpitations  [  ] diarrhea [  ] increased anxiety [  ] muscle weakness  [ x ] heat intolerance- occasional [  ] fatigue   [  ] proptosis [  ] problems with eye closure or color vision [  ] noticing any enlargement in size of thyroid [  ] lumps in neck [  ] dysphagia  [  ] change in voice [  ]  SOB when lying down   I have reviewed the patient's past medical history, medications and allergies.   No current outpatient prescriptions on file prior to visit.   No current facility-administered medications on file prior to visit.   No Known Allergies   PE: BP 120/74  Pulse 80  Wt 211 lb 8 oz (95.936 kg)  SpO2 97%  LMP 10/28/2012 Wt Readings from Last 3 Encounters:  08/17/14 211 lb 8 oz (95.936 kg)  06/14/14 216 lb 4 oz (98.09 kg)  04/12/14 231 lb 8 oz (105.008 kg)    HEENT: [place]/AT, EOMI, no icterus, no proptosis, no chemosis, no  mild lid lag, no retraction, eyes close completely, no overt hirsuitism Neck: thyroid gland - smooth but with possible left lower pole nodularity, non-tender, no erythema, no tracheal deviation; negative Pemberton's sign; no lymphadenopathy; no bruits Lungs: good air entry, clear bilaterally Heart: S1&S2 normal, regular rate & rhythm; no murmurs, rubs or gallops Abd: soft, NT, ND, no HSM, +BS, no abnormal straie Ext: no tremor in hands bilaterally, no edema, 2+ DP/PT pulses, good muscle mass Neuro: normal gait, 2+ reflexes bilaterally, normal 5/5 strength, no proximal myopathy  Derm: no pretibial myxoedema/skin dryness   ASSESSMENT: 1. Low TSH/Hyperthyroidism   PLAN:   Problem List Items Addressed This Visit     Endocrine    Hyperthyroidism - Primary     Appears to be clinically euthyroid today. Heart rate well controlled today. Last thyroid levels showed normal TSH in the low normal range.  Update thyroid tests at this time to assess trend.  If labs are stable- then continue to monitor.  If labs are showing worsening- then will consider a diagnostic scan for further evaluation.   [facility] Overall no focal nodules bilaterally- continue to monitor.   RTC 4 months    Relevant Orders      Thyroid Profile II      T3, free      T4, free     Patient wants to defer further workup of reported remote elevation of cortisol level at this time. No overt hirsuitism. No overt signs of Cushing's. Don't feel that this is related to her hyperthyroidism at this time.   Gaege Sangalang [facility] 08/17/2014  4:21 PM

## 2014-08-17 NOTE — Patient Instructions (Signed)
Labs today.  Monitor for symptoms of hyperthyroidism- palpitations, tremors, diarrhea, unintentional weight loss.   Please come back for a follow-up appointment in 4 months

## 2014-08-17 NOTE — Assessment & Plan Note (Signed)
Appears to be clinically euthyroid today. Heart rate well controlled today. Last thyroid levels showed normal TSH in the low normal range.  Update thyroid tests at this time to assess trend.  If labs are stable- then continue to monitor.  If labs are showing worsening- then will consider a diagnostic scan for further evaluation.   [facility] Overall no focal nodules bilaterally- continue to monitor.   RTC 4 months

## 2014-08-17 NOTE — Progress Notes (Signed)
Pre visit review using our clinic review tool, if applicable. No additional management support is needed unless otherwise documented below in the visit note. 

## 2014-08-18 ENCOUNTER — Other Ambulatory Visit: Payer: Self-pay | Admitting: Endocrinology

## 2014-08-18 DIAGNOSIS — E0590 Thyrotoxicosis, unspecified without thyrotoxic crisis or storm: Secondary | ICD-10-CM

## 2014-08-18 LAB — THYROID PROFILE II
Free Thyroxine Index: 1.8 (ref 1.2–4.9)
T3 Uptake Ratio: 25 % (ref 24–39)
T3, Total: 121 ng/dL (ref 71–180)
T4, Total: 7.1 ug/dL (ref 4.5–12.0)
TSH: 8.09 u[IU]/mL — ABNORMAL HIGH (ref 0.450–4.500)

## 2014-08-18 LAB — T4, FREE: Free T4: 0.75 ng/dL (ref 0.60–1.60)

## 2014-08-18 LAB — T3, FREE: T3, Free: 2.7 pg/mL (ref 2.3–4.2)

## 2014-09-01 ENCOUNTER — Encounter: Payer: Self-pay | Admitting: Endocrinology

## 2014-09-02 ENCOUNTER — Other Ambulatory Visit: Payer: Self-pay

## 2014-09-02 DIAGNOSIS — Z1231 Encounter for screening mammogram for malignant neoplasm of breast: Secondary | ICD-10-CM

## 2014-09-19 ENCOUNTER — Other Ambulatory Visit (INDEPENDENT_AMBULATORY_CARE_PROVIDER_SITE_OTHER): Payer: BC Managed Care – PPO

## 2014-09-19 DIAGNOSIS — E0590 Thyrotoxicosis, unspecified without thyrotoxic crisis or storm: Secondary | ICD-10-CM

## 2014-09-20 LAB — T3, FREE: T3, Free: 3 pg/mL (ref 2.3–4.2)

## 2014-09-20 LAB — THYROID PROFILE II
Free Thyroxine Index: 1.5 (ref 1.2–4.9)
T3 Uptake Ratio: 22 % — ABNORMAL LOW (ref 24–39)
T3, Total: 123 ng/dL (ref 71–180)
T4, Total: 6.9 ug/dL (ref 4.5–12.0)
TSH: 5.36 u[IU]/mL — ABNORMAL HIGH (ref 0.450–4.500)

## 2014-09-20 LAB — T4, FREE: Free T4: 0.71 ng/dL (ref 0.60–1.60)

## 2014-09-20 LAB — THYROGLOBULIN ANTIBODY PANEL
Thyroglobulin Ab: <1 IU/mL (ref <2)
Thyroglobulin: 65.2 ng/mL — ABNORMAL HIGH (ref 2.8–40.9)
Thyroperoxidase Ab SerPl-aCnc: 246 IU/mL — ABNORMAL HIGH (ref <9)

## 2014-09-22 ENCOUNTER — Ambulatory Visit: Payer: Self-pay | Admitting: Internal Medicine

## 2014-09-27 ENCOUNTER — Ambulatory Visit
Admission: RE | Admit: 2014-09-27 | Discharge: 2014-09-27 | Disposition: A | Discharge status: Home / Self Care | Payer: BC Managed Care – PPO | Source: Ambulatory Visit

## 2014-09-27 ENCOUNTER — Encounter (INDEPENDENT_AMBULATORY_CARE_PROVIDER_SITE_OTHER): Payer: Self-pay

## 2014-09-27 DIAGNOSIS — Z1231 Encounter for screening mammogram for malignant neoplasm of breast: Secondary | ICD-10-CM

## 2014-09-29 ENCOUNTER — Telehealth: Payer: Self-pay | Admitting: Internal Medicine

## 2014-09-29 DIAGNOSIS — R0683 Snoring: Secondary | ICD-10-CM

## 2014-09-29 LAB — HM MAMMOGRAPHY: HM Mammogram: NORMAL

## 2014-09-29 NOTE — Telephone Encounter (Signed)
Mammogram was normal Sleep study showed only very mild events, not enough to need CPAP.  Dr Kathyrn Sheriff recommend trial of daily steroid nasal spray (Flonase and nasocort are both available OTC )  and weight loss

## 2014-09-29 NOTE — Assessment & Plan Note (Signed)
Sleep study showed only very mild events, not enough to need CPAP.  Dr Kathyrn Sheriff recommend trial of daily steroid nasal spray and weight loss

## 2014-09-30 NOTE — Telephone Encounter (Signed)
Patient notified

## 2014-10-04 ENCOUNTER — Telehealth: Payer: Self-pay | Admitting: *Deleted

## 2014-10-04 NOTE — Telephone Encounter (Signed)
Patient left voice message asking about results of labs drawn with Dr. Howell Rucks on 09/19/14.

## 2014-10-06 NOTE — Telephone Encounter (Signed)
Called patient, person who answered the phone stated she was not home. Patient notified of lab results via letter in the mail r/t several phone call attempts unanswered.

## 2014-10-12 ENCOUNTER — Encounter: Payer: Self-pay | Admitting: Internal Medicine

## 2014-12-21 ENCOUNTER — Encounter: Payer: Self-pay | Admitting: Endocrinology

## 2014-12-21 ENCOUNTER — Ambulatory Visit (INDEPENDENT_AMBULATORY_CARE_PROVIDER_SITE_OTHER): Payer: BLUE CROSS/BLUE SHIELD | Admitting: Endocrinology

## 2014-12-21 VITALS — BP 118/78 | HR 66 | Resp 12 | Ht 67.0 in | Wt 221.5 lb

## 2014-12-21 DIAGNOSIS — E0590 Thyrotoxicosis, unspecified without thyrotoxic crisis or storm: Secondary | ICD-10-CM

## 2014-12-21 NOTE — Progress Notes (Signed)
Reason for visit: Low TSH/hyperthyroidism HPI  Kristin Davis is a 44 y.o.-year-old female,  Here for follow up of hyperthyroidism/low TSH. Last seen  October 2015.    The patient denies any prior personal history of thyroid problems.  Mom has hypothyroidism. she denies any personal XRT exposure to her neck area. No recent hospitalizations or illness. Did have a couple of episodes of Sharp pain in neck and thyroid area August 2015, but denies any soreness that is persistent. Had been stressed lately as daughter is off to [employer].  Denies any recent ingestion of seaweed/kelp or recent exposure to iv contrast dye. No recent steroid use or use of herbal supplements. Using coconut oil now a days to help with thyroid.  Had a hysterectomy for menorrhagia in march 2015. History of ovarian cancer in family, so had bilateral ovarianectomy as well. Not on hormones.  Reports being symptomatic 1 month after her surgery (~ April 2015) with hair loss, On and off hot flashes and fatigue. Reports being anemic at time of discharge. Now that HB is better, her energy levels are better but has above symptoms- is considering starting HRT.  Started back on Lifestyle changes this Summer. Had lost weight through [employer] and exercise, now hasn't been doing so well lately.   Also, wonders if her facial hair is related to her thyroid. Has noted increased facial hair since puberty. Was tested several years ago for PCOS, but testing came back okay. Asked at that time to start BCPs, which she didn't want to pursue. Now shaving daily for facial hair. Reports that cortisol testing at that time with 24 hour urine was mildy abnormal. Periods were regular prior to recent uterine surgery. No breast discharge. Has been having prominent facial hair since puberty. No recent steroids or abnormal stretch marks or easy bruising.      I reviewed pt's thyroid tests: Lab Results  Component Value Date   TSH 5.360* 09/19/2014   TSH 8.090* 08/17/2014   TSH 0.654 06/14/2014   TSH 0.009* 05/30/2014   TSH 0.32* 04/12/2014   TSH 2.55 11/12/2012   FREET4 0.71 09/19/2014   FREET4 0.75 08/17/2014   FREET4 0.56* 06/14/2014   FREET4 1.02 04/14/2014    Other pertinent labs are as follows: Lab Results  Component Value Date   AST 29 06/14/2014   Lab Results  Component Value Date   ALT 35 06/14/2014   TSI levels normal August 2015  TPO elevated August 2015  Review of systems:  [ x ] complains of   [  ] denies  [  ] weight lossl [  ]  tremors [  ] palpitations  [  ] diarrhea [  ] increased anxiety [  ] muscle weakness  [ x ] heat intolerance- occasional [  ] fatigue   [  ] proptosis [  ] problems with eye closure or color vision [  ] noticing any enlargement in size of thyroid [  ] lumps in neck [  ] dysphagia  [  ] change in voice [  ]  SOB when lying down 10 lb weight gain since past visit.    I have reviewed the patient's past medical history, medications and allergies.   No current outpatient prescriptions on file prior to visit.   No current facility-administered medications on file prior to visit.   No Known Allergies   PE: BP 118/78 mmHg  Pulse 66  Resp 12  Ht 5\' 7"  (1.702  m)  Wt 221 lb 8 oz (100.472 kg)  BMI 34.68 kg/m2  SpO2 97%  LMP 10/28/2012 Wt Readings from Last 3 Encounters:  12/21/14 221 lb 8 oz (100.472 kg)  08/17/14 211 lb 8 oz (95.936 kg)  06/14/14 216 lb 4 oz (98.09 kg)    HEENT: [place]/AT, EOMI, no icterus, no proptosis, no chemosis, no mild lid lag, no retraction, eyes close completely, no overt hirsuitism Neck: thyroid gland - smooth but with possible left lower pole nodularity- now not that noticable, non-tender, no erythema, no tracheal deviation; negative Pemberton's sign; no lymphadenopathy; no bruits Lungs: good air entry, clear bilaterally Heart: S1&S2 normal, regular rate & rhythm; no murmurs, rubs or gallops Ext: no tremor in hands bilaterally, no edema, 2+ DP/PT  pulses, good muscle mass Neuro: normal gait, 2+ reflexes bilaterally, normal 5/5 strength, no proximal myopathy  Derm: no pretibial myxoedema/skin dryness   ASSESSMENT: 1. Low TSH/Hyperthyroidism likely from autoimmune thyroiditis.   PLAN:   Problem List Items Addressed This Visit      Endocrine   Hyperthyroidism - Primary    Appears to be clinically euthyroid today. Heart rate well controlled today. Last thyroid levels showed elevated TSH.  Update thyroid tests at this time to assess trend.  If labs are stable-improving- then continue to monitor.  If labs are showing worsening- then will consider starting thyroid hormone if TSH above 10.  Continue to monitor for symptoms of hypo- and hyper-thyroidism and notify if they occur.   Results of thyroid US done Sept 2015 -Overall no focal nodules bilaterally- continue to monitor.   RTC 3 months        Relevant Orders   Thyroid Profile II   T4, free   T3, free     Patient wants to defer further workup of reported remote elevation of cortisol level at this time. No overt hirsuitism. No overt signs of Cushing's. Don't feel that this is related to her hyperthyroidism at this time.   Zeev Deakins [facility] 12/21/2014  4:06 PM

## 2014-12-21 NOTE — Assessment & Plan Note (Signed)
Appears to be clinically euthyroid today. Heart rate well controlled today. Last thyroid levels showed elevated TSH.  Update thyroid tests at this time to assess trend.  If labs are stable-improving- then continue to monitor.  If labs are showing worsening- then will consider starting thyroid hormone if TSH above 10.  Continue to monitor for symptoms of hypo- and hyper-thyroidism and notify if they occur.   Results of thyroid US done Sept 2015 -Overall no focal nodules bilaterally- continue to monitor.   RTC 3 months

## 2014-12-21 NOTE — Progress Notes (Signed)
Pre visit review using our clinic review tool, if applicable. No additional management support is needed unless otherwise documented below in the visit note. 

## 2014-12-21 NOTE — Patient Instructions (Signed)
Watch for symptoms of hyper and hypo- thyroidism.   Labs today. Assess need for thyroid hormone.    Please come back for a follow-up appointment in 3 months

## 2014-12-22 LAB — T4, FREE: Free T4: 0.96 ng/dL (ref 0.60–1.60)

## 2014-12-22 LAB — THYROID PROFILE II
Free Thyroxine Index: 2.3 (ref 1.2–4.9)
T3 Uptake Ratio: 24 % (ref 24–39)
T3, Total: 138 ng/dL (ref 71–180)
T4, Total: 9.5 ug/dL (ref 4.5–12.0)
TSH: 3.47 u[IU]/mL (ref 0.450–4.500)

## 2014-12-22 LAB — T3, FREE: T3, Free: 2.9 pg/mL (ref 2.3–4.2)

## 2014-12-23 ENCOUNTER — Encounter: Payer: Self-pay | Admitting: *Deleted

## 2015-02-11 NOTE — Op Note (Signed)
PATIENT NAME:  Kristin Davis, Kristin Davis MR#:  [id number] DATE OF BIRTH:  [birth year]  DATE OF PROCEDURE:  12/27/2013  PREOPERATIVE DIAGNOSES: 1.  Abnormal uterine bleeding/menorrhagia.  2.  Severe dysmenorrhea.  3.  Family history of ovarian cancer.  4.  Family history of endometriosis.   POSTOPERATIVE DIAGNOSES:  1.  Abnormal uterine bleeding/menorrhagia.  2.  Severe dysmenorrhea.  3.  Family history of ovarian cancer.  4.  Family history of endometriosis.  5.  Pelvic adhesive disease.   OPERATIVE PROCEDURES: 1.  Laparoscopy.  2.  Total abdominal hysterectomy, bilateral salpingo-oophorectomy and adhesiolysis.   SURGEON: Alanda Slim. Kazue Cerro, MD   FIRST ASSISTANT: Dr. Marcelline Mates  SECOND ASSISTANT: Massie Bougie, PA-S  ANESTHESIA: General endotracheal.   INDICATIONS: The patient is a 44 year old married white female, multiparous, with history of chronic menorrhagia and severe dysmenorrhea who presents for definitive surgery. She did have a previous diagnosis of endometrial hyperplasia which was subsequently treated with progestin and most recent biopsy demonstrated no significant residual hyperplasia. The patient also has family history of ovarian cancer and desires BSO to be done.   FINDINGS AT SURGERY: Revealed there to be dense pelvic adhesive disease with the cul-de-sac being obliterated. The right and left adnexa were densely adherent to the pelvic sidewalls through adhesions. Upper abdomen was grossly normal.   DESCRIPTION OF PROCEDURE: The patient was brought to the operating room where she was placed in the supine position. General endotracheal anesthesia was introduced without difficulty. She was placed in the low lithotomy position using the bumblebee stirrups. A ChloraPrep and Betadine abdominal, perineal, and intravaginal prep and drape was performed in standard fashion. A Hulka tenaculum was placed onto the cervix to facilitate uterine manipulation. A Foley catheter was placed and was  draining clear yellow urine. The subumbilical vertical incision, 5 mm in length, was made. The Optiview laparoscopic trocar system was used to place a 5 mm port directly into the abdominal pelvic cavity without evidence of bowel or vascular injury. Two other 5 mm ports were placed in the right and left lower quadrants, respectively. The above-noted findings were photo documented. Decision was made to abort the laparoscopic hysterectomy because of the dense pelvic adhesions which obliterated the cul-de-sac. This in addition to the adnexal adhesions prompted open laparotomy.   The patient was readied for the laparotomy. Laparoscopy ports were removed. A Pfannenstiel incision was made to the abdomen. This was extended into the fascia which was incised transversely. The rectus muscle was dissected off the fascia through sharp and blunt dissection. The midline raphe was identified, separated, and the peritoneum was entered. O'Connor-O'Sullivan retractor was placed to facilitate exposure. Laps were used to pack off the bowel. Both sharp and blunt dissection was then performed to take down adhesions in both cul-de-sac and the adnexal regions bilaterally. Following this adhesiolysis procedure, the hysterectomy was then performed. The uterus was grasped at the uterine cornu with Kelly clamps. Round ligaments were doubly clamped, cut, and stick tied using 0 Vicryl suture. The posterior leaf of the broad ligament was opened and the utero-ovarian ligament was then doubly clamped and cut and stick tied. This was done bilaterally. Decision was made to remove the adnexa at the end of the case. The anterior leaf of the broad ligament was opened and bladder flap was created. The uterine vessels were skeletonized and then doubly clamped and cut with Mayo scissors. Stick tie sutures were used to facilitate hemostasis. Sequentially, the cardinal broad ligament complexes were then taken down through a  clamping, cutting, and stick  tying technique. During this time further adhesiolysis was performed because of the dense adhesions. The uterosacral ligaments were clamped, cut, and stick tied. Eventually the cervicovaginal junction was then crossclamped and incised with the uterus being removed from the operative field. The vaginal cuff was then reapproximated using 2-0 Vicryl sutures in a figure-of-eight technique. The adnexa were then taken with the self-retainers being used to crossclamp the pedicle. Care was taken to avoid the ureters. The specimens were sent to pathology. The pelvis was then inspected. All pedicles were intact. Copious irrigation was performed in the pelvis. The abdomen was then closed in layers using 0 Maxon on the fascia in a simple running manner. The adipose layer was reapproximated using simple interrupted sutures of 2-0 Vicryl. The skin was closed with a subcuticular stitch of 4-0 Vicryl. Dermabond was placed over the incision. Dermabond was also placed over the laparoscopy ports. The patient was then awakened, extubated, and taken to the recovery room in satisfactory condition.   ESTIMATED BLOOD LOSS: 300 mL.   IV FLUIDS: 1500 mL.  URINE OUTPUT: Not quantified at the time of this dictation.   COUNTS: All instrument, needle, and sponge counts were verified as correct.   ANTIBIOTICS: The patient did receive Ancef antibiotic prophylaxis.  ____________________________ Alanda Slim Virga Haltiwanger, MD mad:sb D: 12/27/2013 12:28:25 ET T: 12/27/2013 14:31:13 ET JOB#: [id number]  cc: Hassell Done A. Tamiah Dysart, MD, <Dictator> Alanda Slim Dezerae Freiberger MD ELECTRONICALLY SIGNED 01/01/2014 11:53

## 2015-03-28 ENCOUNTER — Ambulatory Visit: Payer: BLUE CROSS/BLUE SHIELD | Admitting: Endocrinology

## 2015-08-10 ENCOUNTER — Ambulatory Visit (INDEPENDENT_AMBULATORY_CARE_PROVIDER_SITE_OTHER): Payer: BLUE CROSS/BLUE SHIELD | Admitting: Internal Medicine

## 2015-08-10 ENCOUNTER — Encounter: Payer: Self-pay | Admitting: Internal Medicine

## 2015-08-10 VITALS — BP 128/84 | HR 66 | Temp 98.6°F | Resp 12 | Ht 67.0 in | Wt 230.5 lb

## 2015-08-10 DIAGNOSIS — Z23 Encounter for immunization: Secondary | ICD-10-CM | POA: Diagnosis not present

## 2015-08-10 DIAGNOSIS — E0590 Thyrotoxicosis, unspecified without thyrotoxic crisis or storm: Secondary | ICD-10-CM | POA: Diagnosis not present

## 2015-08-10 DIAGNOSIS — E669 Obesity, unspecified: Secondary | ICD-10-CM

## 2015-08-10 DIAGNOSIS — R5383 Other fatigue: Secondary | ICD-10-CM | POA: Diagnosis not present

## 2015-08-10 DIAGNOSIS — K209 Esophagitis, unspecified without bleeding: Secondary | ICD-10-CM | POA: Insufficient documentation

## 2015-08-10 DIAGNOSIS — K2970 Gastritis, unspecified, without bleeding: Secondary | ICD-10-CM

## 2015-08-10 DIAGNOSIS — K219 Gastro-esophageal reflux disease without esophagitis: Secondary | ICD-10-CM | POA: Insufficient documentation

## 2015-08-10 DIAGNOSIS — N920 Excessive and frequent menstruation with regular cycle: Secondary | ICD-10-CM | POA: Insufficient documentation

## 2015-08-10 DIAGNOSIS — K2990 Gastroduodenitis, unspecified, without bleeding: Secondary | ICD-10-CM

## 2015-08-10 LAB — COMPREHENSIVE METABOLIC PANEL
ALT: 33 U/L (ref 0–35)
AST: 28 U/L (ref 0–37)
Albumin: 4.3 g/dL (ref 3.5–5.2)
Alkaline Phosphatase: 62 U/L (ref 39–117)
BUN: 15 mg/dL (ref 6–23)
CO2: 24 mEq/L (ref 19–32)
Calcium: 9.9 mg/dL (ref 8.4–10.5)
Chloride: 103 mEq/L (ref 96–112)
Creatinine, Ser: 0.75 mg/dL (ref 0.40–1.20)
GFR: 88.21 mL/min (ref >60.00)
Glucose, Bld: 85 mg/dL (ref 70–99)
Potassium: 3.8 mEq/L (ref 3.5–5.1)
Sodium: 139 mEq/L (ref 135–145)
Total Bilirubin: 0.5 mg/dL (ref 0.2–1.2)
Total Protein: 7.7 g/dL (ref 6.0–8.3)

## 2015-08-10 LAB — CBC WITH DIFFERENTIAL/PLATELET
Basophils Absolute: 0 10*3/uL (ref 0.0–0.1)
Basophils Relative: 0.6 % (ref 0.0–3.0)
Eosinophils Absolute: 0.1 10*3/uL (ref 0.0–0.7)
Eosinophils Relative: 1.3 % (ref 0.0–5.0)
HCT: 44.8 % (ref 36.0–46.0)
Hemoglobin: 14.8 g/dL (ref 12.0–15.0)
Lymphocytes Relative: 40.7 % (ref 12.0–46.0)
Lymphs Abs: 2.8 10*3/uL (ref 0.7–4.0)
MCHC: 33.1 g/dL (ref 30.0–36.0)
MCV: 92.3 fl (ref 78.0–100.0)
Monocytes Absolute: 0.6 10*3/uL (ref 0.1–1.0)
Monocytes Relative: 8.5 % (ref 3.0–12.0)
Neutro Abs: 3.4 10*3/uL (ref 1.4–7.7)
Neutrophils Relative %: 48.9 % (ref 43.0–77.0)
Platelets: 215 10*3/uL (ref 150.0–400.0)
RBC: 4.86 Mil/uL (ref 3.87–5.11)
RDW: 12.7 % (ref 11.5–15.5)
WBC: 6.9 10*3/uL (ref 4.0–10.5)

## 2015-08-10 LAB — MAGNESIUM: Magnesium: 1.9 mg/dL (ref 1.5–2.5)

## 2015-08-10 MED ORDER — PANTOPRAZOLE SODIUM 40 MG PO TBEC: 40.0000 mg | DELAYED_RELEASE_TABLET | Freq: Every day | ORAL | Status: DC

## 2015-08-10 NOTE — Progress Notes (Signed)
Subjective:  Patient ID: Kristin Davis, female    DOB: 05-Jul-1971  Age: 44 y.o. MRN: [id number]  CC: The primary encounter diagnosis was Hyperthyroidism. Diagnoses of Gastritis and gastroduodenitis, Esophagitis, unspecified, Other fatigue, Encounter for immunization, and Obesity (BMI 30-39.9) were also pertinent to this visit.  HPI Kristin Davis presents for follow up on thyroid problems and obesity  Lost 15 lbs last summer using [employer],,  Was diagnosed with hyperthyroid,.  Stopped the [employer] during the thyroid issues..  Had endocrinology evaluation.  She has gained all weight back, 10 lbs,  Feeling really tired,  Come constipation moving bowels 2-3/week . Some muscle cramping legs and left shoulder.  Wakes up and is tired an hour later.  Doesn't sore,  Sleep study test was normal last year.   Thinks she may have a gluten allergy and food allergy,  Gets esophageal irritation with certain foods. Even eggs cause abdominal pain .scrambled with coconut oil., or with butter   No outpatient prescriptions prior to visit.   No facility-administered medications prior to visit.    Review of Systems;  Patient denies headache, fevers, malaise, unintentional weight loss, skin rash, eye pain, sinus congestion and sinus pain, sore throat, dysphagia,  hemoptysis , cough, dyspnea, wheezing, chest pain, palpitations, orthopnea, edema, abdominal pain, nausea, melena, diarrhea, constipation, flank pain, dysuria, hematuria, urinary  Frequency, nocturia, numbness, tingling, seizures,  Focal weakness, Loss of consciousness,  Tremor, insomnia, depression, anxiety, and suicidal ideation.      Objective:  BP 128/84 mmHg  Pulse 66  Temp(Src) 98.6 F (37 C) (Oral)  Resp 12  Ht 5\' 7"  (1.702 m)  Wt 230 lb 8 oz (104.554 kg)  BMI 36.09 kg/m2  SpO2 100%  LMP 10/28/2012  BP Readings from Last 3 Encounters:  08/10/15 128/84  12/21/14 118/78  08/17/14 120/74    Wt Readings from Last 3  Encounters:  08/10/15 230 lb 8 oz (104.554 kg)  12/21/14 221 lb 8 oz (100.472 kg)  08/17/14 211 lb 8 oz (95.936 kg)    General appearance: alert, cooperative and appears stated age Ears: normal TM's and external ear canals both ears Throat: lips, mucosa, and tongue normal; teeth and gums normal Neck: no adenopathy, no carotid bruit, supple, symmetrical, trachea midline and thyroid not enlarged, symmetric, no tenderness/mass/nodules Back: symmetric, no curvature. ROM normal. No CVA tenderness. Lungs: clear to auscultation bilaterally Heart: regular rate and rhythm, S1, S2 normal, no murmur, click, rub or gallop Abdomen: soft, non-tender; bowel sounds normal; no masses,  no organomegaly Pulses: 2+ and symmetric Skin: Skin color, texture, turgor normal. No rashes or lesions Lymph nodes: Cervical, supraclavicular, and axillary nodes normal.  No results found for: HGBA1C  Lab Results  Component Value Date   CREATININE 0.75 08/10/2015   CREATININE 0.9 04/12/2014   CREATININE 0.7 11/12/2012    Lab Results  Component Value Date   WBC 6.9 08/10/2015   HGB 14.8 08/10/2015   HCT 44.8 08/10/2015   PLT 215.0 08/10/2015   GLUCOSE 85 08/10/2015   CHOL 211* 11/12/2012   TRIG 153.0* 11/12/2012   HDL 35.20* 11/12/2012   LDLDIRECT 141.8 11/12/2012   ALT 33 08/10/2015   AST 28 08/10/2015   NA 139 08/10/2015   K 3.8 08/10/2015   CL 103 08/10/2015   CREATININE 0.75 08/10/2015   BUN 15 08/10/2015   CO2 24 08/10/2015   TSH 3.470 12/21/2014    Mm Screening Breast Tomo Bilateral  09/28/2014  CLINICAL DATA:  Screening. EXAM: DIGITAL SCREENING BILATERAL MAMMOGRAM WITH 3D TOMO WITH CAD COMPARISON:  09/21/2013 ACR Breast Density Category b: There are scattered areas of fibroglandular density. FINDINGS: There are no findings suspicious for malignancy. Images were processed with CAD. IMPRESSION: No mammographic evidence of malignancy. A result letter of this screening mammogram will be mailed  directly to the patient. RECOMMENDATION: Screening mammogram in one year. (Code:SM-B-01Y) BI-RADS CATEGORY  1: Negative. Electronically Signed   By: Rosemarie Ax   On: 09/28/2014 12:39    Assessment & Plan:   Problem List Items Addressed This Visit    Obesity (BMI 30-39.9)    Reviewed her previous success at weight loss,  Her goal weight for BMI < 30  Is 190 lbs. I have addressed  BMI and recommended wt loss of 10% of body weight over the next 6 months using a low glycemic index diet and regular exercise a minimum of 5 days per week.        Hyperthyroidism - Primary    Thyroid function is pending.  She has symptoms of hyothyroidism.   Lab Results  Component Value Date   TSH 3.470 12/21/2014         Relevant Orders   Thyroid Profile II   Esophagitis, unspecified    Patient requesting testing for celiac disease, which has been ordered.  Treating for reflux esophagitis.       Gastritis and gastroduodenitis   Relevant Orders   Celiac Disease Ab Screen w/Rfx    Other Visit Diagnoses    Other fatigue        Relevant Orders    CBC with Differential/Platelet (Completed)    Comprehensive metabolic panel (Completed)    Magnesium (Completed)    Encounter for immunization           I am having Ms. Fullilove start on pantoprazole.  Meds ordered this encounter  Medications  . pantoprazole (PROTONIX) 40 MG tablet    Sig: Take 1 tablet (40 mg total) by mouth daily.    Dispense:  30 tablet    Refill:  3    There are no discontinued medications.  Follow-up: No Follow-up on file.   Crecencio Mc, MD

## 2015-08-10 NOTE — Patient Instructions (Signed)
Esophagitis °Esophagitis is inflammation of the esophagus. The esophagus is the tube that carries food and liquids from your mouth to your stomach. Esophagitis can cause soreness or pain in the esophagus. This condition can make it difficult and painful to swallow.  °CAUSES °Most causes of esophagitis are not serious. Common causes of this condition include: °· Gastroesophageal reflux disease (GERD). This is when stomach contents move back up into the esophagus (reflux). °· Repeated vomiting. °· An allergic-type reaction, especially caused by food allergies (eosinophilic esophagitis). °· Injury to the esophagus by swallowing large pills with or without water, or swallowing certain types of medicines. °· Swallowing (ingesting) harmful chemicals, such as household cleaning products. °· Heavy alcohol use. °· An infection of the esophagus. This most often occurs in people who have a weakened immune system. °· Radiation or chemotherapy treatment for cancer. °· Certain diseases such as sarcoidosis, Crohn disease, and scleroderma. °SYMPTOMS °Symptoms of this condition include: °· Difficult or painful swallowing. °· Pain with swallowing acidic liquids, such as citrus juices. °· Pain with burping. °· Chest pain. °· Difficulty breathing. °· Nausea. °· Vomiting. °· Pain in the abdomen. °· Weight loss. °· Ulcers in the mouth. °· Patches of white material in the mouth (candidiasis). °· Fever. °· Coughing up blood or vomiting blood. °· Stool that is black, tarry, or bright red. °DIAGNOSIS °Your health care provider will take a medical history and perform a physical exam. You may also have other tests, including: °· An endoscopy to examine your stomach and esophagus with a small camera. °· A test that measures the acidity level in your esophagus. °· A test that measures how much pressure is on your esophagus. °· A barium swallow or modified barium swallow to show the shape, size, and functioning of your esophagus. °· Allergy  tests. °TREATMENT °Treatment for this condition depends on the cause of your esophagitis. In some cases, steroids or other medicines may be given to help relieve your symptoms or to treat the underlying cause of your condition. You may have to make some lifestyle changes, such as: °· Avoiding alcohol. °· Quitting smoking. °· Changing your diet. °· Exercising. °· Changing your sleep habits and your sleep environment. °HOME CARE INSTRUCTIONS °Take these actions to decrease your discomfort and to help avoid complications. °Diet °· Follow a diet as recommended by your health care provider. This may involve avoiding foods and drinks such as: °¨ Coffee and tea (with or without caffeine). °¨ Drinks that contain alcohol. °¨ Energy drinks and sports drinks. °¨ Carbonated drinks or sodas. °¨ Chocolate and cocoa. °¨ Peppermint and mint flavorings. °¨ Garlic and onions. °¨ Horseradish. °¨ Spicy and acidic foods, including peppers, chili powder, curry powder, vinegar, hot sauces, and barbecue sauce. °¨ Citrus fruit juices and citrus fruits, such as oranges, lemons, and limes. °¨ Tomato-based foods, such as red sauce, chili, salsa, and pizza with red sauce. °¨ Fried and fatty foods, such as donuts, french fries, potato chips, and high-fat dressings. °¨ High-fat meats, such as hot dogs and fatty cuts of red and white meats, such as rib eye steak, sausage, ham, and bacon. °¨ High-fat dairy items, such as whole milk, butter, and cream cheese. °· Eat small, frequent meals instead of large meals. °· Avoid drinking large amounts of liquid with your meals. °· Avoid eating meals during the 2-3 hours before bedtime. °· Avoid lying down right after you eat. °· Do not exercise right after you eat. °· Avoid foods and drinks that seem to   make your symptoms worse. °General Instructions °· Pay attention to any changes in your symptoms. °· Take over-the-counter and prescription medicines only as told by your health care provider. Do not take  aspirin, ibuprofen, or other NSAIDs unless your health care provider told you to do so. °· If you have trouble taking pills, use a pill splitter to decrease the size of the pill. This will decrease the chance of the pill getting stuck or injuring your esophagus on the way down. Also, drink water after you take a pill. °· Do not use any tobacco products, including cigarettes, chewing tobacco, and e-cigarettes. If you need help quitting, ask your health care provider. °· Wear loose-fitting clothing. Do not wear anything tight around your waist that causes pressure on your abdomen. °· Raise (elevate) the head of your bed about 6 inches (15 cm). °· Try to reduce your stress, such as with yoga or meditation. If you need help reducing stress, ask your health care provider. °· If you are overweight, reduce your weight to an amount that is healthy for you. Ask your health care provider for guidance about a safe weight loss goal. °· Keep all follow-up visits as told by your health care provider. This is important. °SEEK MEDICAL CARE IF: °· You have new symptoms. °· You have unexplained weight loss. °· You have difficulty swallowing, or it hurts to swallow. °· You have wheezing or a persistent cough. °· Your symptoms do not improve with treatment. °· You have frequent heartburn for more than two weeks. °SEEK IMMEDIATE MEDICAL CARE IF: °· You have severe pain in your arms, neck, jaw, teeth, or back. °· You feel sweaty, dizzy, or light-headed. °· You have chest pain or shortness of breath. °· You vomit and your vomit looks like blood or coffee grounds. °· Your stool is bloody or black. °· You have a fever. °· You cannot swallow, drink, or eat. °  °This information is not intended to replace advice given to you by your health care provider. Make sure you discuss any questions you have with your health care provider. °  °Document Released: 11/14/2004 Document Revised: 06/28/2015 Document Reviewed: 02/01/2015 °Elsevier Interactive  Patient Education ©2016 [employer] ° °

## 2015-08-10 NOTE — Progress Notes (Signed)
Pre-visit discussion using our clinic review tool. No additional management support is needed unless otherwise documented below in the visit note.  

## 2015-08-12 NOTE — Assessment & Plan Note (Addendum)
Thyroid function is pending.  She has symptoms of hyothyroidism.   Lab Results  Component Value Date   TSH 3.470 12/21/2014

## 2015-08-12 NOTE — Assessment & Plan Note (Signed)
Patient requesting testing for celiac disease, which has been ordered.  Treating for reflux esophagitis.

## 2015-08-12 NOTE — Assessment & Plan Note (Signed)
Reviewed her previous success at weight loss,  Her goal weight for BMI < 30  Is 190 lbs. I have addressed  BMI and recommended wt loss of 10% of body weight over the next 6 months using a low glycemic index diet and regular exercise a minimum of 5 days per week.

## 2015-08-15 LAB — CELIAC DISEASE AB SCREEN W/RFX
Antigliadin Abs, IgA: 8 units (ref 0–19)
IgA/Immunoglobulin A, Serum: 292 mg/dL (ref 87–352)
Transglutaminase IgA: <2 U/mL (ref 0–3)

## 2015-08-15 LAB — THYROID PROFILE II
Free Thyroxine Index: 2.4 (ref 1.2–4.9)
T3 Uptake Ratio: 24 % (ref 24–39)
T3, Total: 145 ng/dL (ref 71–180)
T4, Total: 10.1 ug/dL (ref 4.5–12.0)
TSH: 2.72 u[IU]/mL (ref 0.450–4.500)

## 2015-08-17 ENCOUNTER — Encounter: Payer: Self-pay | Admitting: *Deleted

## 2016-01-03 ENCOUNTER — Telehealth: Payer: Self-pay | Admitting: Internal Medicine

## 2016-01-03 NOTE — Telephone Encounter (Signed)
Please advise, it you thinks pt can be worked in today

## 2016-01-03 NOTE — Telephone Encounter (Signed)
Call and get more info such as audible wheezing, color of sputum, fever today any OTC meds ?

## 2016-01-03 NOTE — Telephone Encounter (Signed)
Please advise, thanks.

## 2016-01-03 NOTE — Telephone Encounter (Signed)
[facility] Patient Name: Kristin Davis DOB: 02-08-1971 Initial Comment Caller states thinks she has bronchitis Nurse Assessment Nurse: Markus Daft, RN, [place] Date/Time (Eastern Time): 01/03/2016 9:50:31 AM Confirm and document reason for call. If symptomatic, describe symptoms. You must click the next button to save text entered. ---Caller states thinks she has bronchitis. C/O productive cough with runny nose. Started over the weekend with fever at 99 - 100 orally. Chest soreness with coughing only, rates 7-8/10. Has the patient traveled out of the country within the last 30 days? ---Not Applicable Does the patient have any new or worsening symptoms? ---Yes Will a triage be completed? ---Yes Related visit to physician within the last 2 weeks? ---No Does the PT have any chronic conditions? (i.e. diabetes, asthma, etc.) ---No Is the patient pregnant or possibly pregnant? (Ask all females between the ages of 73-55) ---No Is this a behavioral health or substance abuse call? ---No Guidelines Guideline Title Affirmed Question Affirmed Notes Cough - Acute Productive Wheezing is present Final Disposition User See Physician within 4 Hours (or PCP triage) Markus Daft, RN, [place] Comments No available appts today at [facility] or [place]. She would rather be seen at office as the co pay is much higher if goes to UC or ER ($5,000 deductable so she would be required to pay the whole thing). Please call her about being worked in if possible. Referrals REFERRED TO PCP OFFICE Disagree/Comply: Comply

## 2016-01-03 NOTE — Telephone Encounter (Signed)
Patient stated that she had wheezing when she exhale,her sputum is yellow. She has not had a fever in the last 24 hours,she has been taking musinex ,alternating the Advil and tylenol .

## 2016-01-04 NOTE — Telephone Encounter (Signed)
Schedule with next available provider.

## 2016-01-04 NOTE — Telephone Encounter (Signed)
Called and spoke with pt., pt states that she will not be coming in for an appt. Pt thinks she will be okay, Iexpressed that she needed to call and schedule an appt if her symptoms do not resolve on their own

## 2016-01-10 ENCOUNTER — Encounter: Payer: Self-pay | Admitting: Family Medicine

## 2016-01-10 ENCOUNTER — Ambulatory Visit (INDEPENDENT_AMBULATORY_CARE_PROVIDER_SITE_OTHER): Payer: BLUE CROSS/BLUE SHIELD | Admitting: Family Medicine

## 2016-01-10 VITALS — BP 134/82 | HR 83 | Temp 98.6°F | Ht 67.0 in | Wt 232.0 lb

## 2016-01-10 DIAGNOSIS — R300 Dysuria: Secondary | ICD-10-CM | POA: Diagnosis not present

## 2016-01-10 DIAGNOSIS — N3001 Acute cystitis with hematuria: Secondary | ICD-10-CM | POA: Diagnosis not present

## 2016-01-10 DIAGNOSIS — J069 Acute upper respiratory infection, unspecified: Secondary | ICD-10-CM | POA: Diagnosis not present

## 2016-01-10 LAB — POCT URINALYSIS DIPSTICK
Bilirubin, UA: NEGATIVE
Glucose, UA: NEGATIVE
Ketones, UA: NEGATIVE
Nitrite, UA: POSITIVE
Spec Grav, UA: >=1.03
Urobilinogen, UA: 0.2
pH, UA: 6

## 2016-01-10 LAB — URINALYSIS, MICROSCOPIC ONLY

## 2016-01-10 MED ORDER — CEPHALEXIN 500 MG PO CAPS: 500.0000 mg | ORAL_CAPSULE | Freq: Two times a day (BID) | ORAL | Status: DC

## 2016-01-10 NOTE — Patient Instructions (Signed)

## 2016-01-10 NOTE — Progress Notes (Signed)
Pre visit review using our clinic review tool, if applicable. No additional management support is needed unless otherwise documented below in the visit note. 

## 2016-01-11 DIAGNOSIS — J069 Acute upper respiratory infection, unspecified: Secondary | ICD-10-CM | POA: Insufficient documentation

## 2016-01-11 DIAGNOSIS — N390 Urinary tract infection, site not specified: Secondary | ICD-10-CM | POA: Insufficient documentation

## 2016-01-11 NOTE — Assessment & Plan Note (Signed)
History and UA consistent with UTI. Benign abdominal exam. We will treat with Keflex. Send urine for culture and microscopy. Given return precautions.

## 2016-01-11 NOTE — Progress Notes (Signed)
Patient ID: Kristin Davis, female   DOB: 06/30/1971, 45 y.o.   MRN: [id number]  Tommi Rumps, MD Phone: [phone]  Kristin Davis is a 45 y.o. female who presents today for same-day visit.  UTI: Patient notes dysuria for 1 day. Has positive frequency and urgency. Some suprapubic discomfort but no other abdominal pain. No vaginal symptoms. She notes some stress incontinence though no other incontinence. She does note she had bronchitis sometime in the last couple of weeks. She notes this is improved. She notes mild cough is significantly better. Minimal congestion at this time. No fevers. Not coughing anything up. She notes with the cough she would leak a small amount of urine and this is a chronic issue, though she wore a different type of underwear when this occurred and thinks this may have predisposed her to a UTI. She feels much improved.  PMH: nonsmoker.   ROS see history of present illness  Objective  Physical Exam Filed Vitals:   01/10/16 1122  BP: 134/82  Pulse: 83  Temp: 98.6 F (37 C)    BP Readings from Last 3 Encounters:  01/10/16 134/82  08/10/15 128/84  12/21/14 118/78   Wt Readings from Last 3 Encounters:  01/10/16 232 lb (105.235 kg)  08/10/15 230 lb 8 oz (104.554 kg)  12/21/14 221 lb 8 oz (100.472 kg)    Physical Exam  Constitutional: She is well-developed, well-nourished, and in no distress.  HENT:  Head: Normocephalic and atraumatic.  Right Ear: External ear normal.  Left Ear: External ear normal.  Mouth/Throat: Oropharynx is clear and moist. No oropharyngeal exudate.  Normal TMs bilaterally  Eyes: Conjunctivae are normal. Pupils are equal, round, and reactive to light.  Neck: Neck supple.  Cardiovascular: Normal rate, regular rhythm and normal heart sounds.   Pulmonary/Chest: Effort normal and breath sounds normal.  Abdominal: Soft. Bowel sounds are normal. She exhibits no distension. There is no tenderness. There is no rebound and no  guarding.  Lymphadenopathy:    She has no cervical adenopathy.  Neurological: She is alert. Gait normal.  Skin: Skin is warm and dry. She is not diaphoretic.     Assessment/Plan: Please see individual problem list.  UTI (urinary tract infection) History and UA consistent with UTI. Benign abdominal exam. We will treat with Keflex. Send urine for culture and microscopy. Given return precautions.  Acute upper respiratory infection Patient with recent upper respiratory infection. Improving at this time. Benign exam. Minimal cough. She'll continue to monitor. If worsens she will let us know. Given return precautions.    Orders Placed This Encounter  Procedures  . Urine Culture  . Urine Microscopic Only  . POCT Urinalysis Dipstick    Meds ordered this encounter  Medications  . cephALEXin (KEFLEX) 500 MG capsule    Sig: Take 1 capsule (500 mg total) by mouth 2 (two) times daily.    Dispense:  14 capsule    Refill:  0    Tommi Rumps, MD [place]

## 2016-01-11 NOTE — Assessment & Plan Note (Signed)
Patient with recent upper respiratory infection. Improving at this time. Benign exam. Minimal cough. She'll continue to monitor. If worsens she will let us know. Given return precautions.

## 2016-01-13 LAB — URINE CULTURE: Colony Count: >=100000

## 2016-01-22 DIAGNOSIS — M7552 Bursitis of left shoulder: Secondary | ICD-10-CM | POA: Diagnosis not present

## 2016-02-14 DIAGNOSIS — M7502 Adhesive capsulitis of left shoulder: Secondary | ICD-10-CM | POA: Diagnosis not present

## 2016-02-14 DIAGNOSIS — M531 Cervicobrachial syndrome: Secondary | ICD-10-CM | POA: Diagnosis not present

## 2016-02-14 DIAGNOSIS — M9901 Segmental and somatic dysfunction of cervical region: Secondary | ICD-10-CM | POA: Diagnosis not present

## 2016-02-16 ENCOUNTER — Encounter: Payer: Self-pay | Admitting: Family Medicine

## 2016-02-16 ENCOUNTER — Ambulatory Visit (INDEPENDENT_AMBULATORY_CARE_PROVIDER_SITE_OTHER): Payer: BLUE CROSS/BLUE SHIELD | Admitting: Family Medicine

## 2016-02-16 VITALS — BP 120/82 | HR 69 | Temp 97.7°F | Ht 67.0 in | Wt 228.1 lb

## 2016-02-16 DIAGNOSIS — R198 Other specified symptoms and signs involving the digestive system and abdomen: Secondary | ICD-10-CM | POA: Diagnosis not present

## 2016-02-16 NOTE — Progress Notes (Signed)
Subjective:  Patient ID: Kristin Davis, female    DOB: 1971/07/01  Age: 45 y.o. MRN: [id number]  CC: ? Abdominal wall spasm  HPI:  45 year old female presents with the above complaint.  Patient states that approximately one month ago she had an episode where she had a hot feeling in her right lower abdomen. She thought it may be a spasm. It resolved spontaneously. Patient states that it recurred again this past week and has occurred a few times. Is located in the right lower quadrant. She describes it as a "weird feeling". She states that it is similar to the feeling of a baby moving while pregnant. No associated pain. No recent fever, chills, nausea, vomiting. No change in her bowels. No reports of an abdominal bulge. She states that it improves with getting up and moving around as well as with pressure. Subsequently resolves quickly. She is concerned about her symptoms. No other complaints today.  Social Hx   Social History   Social History  . Marital Status: Married    Spouse Name: N/A  . Number of Children: N/A  . Years of Education: N/A   Social History Main Topics  . Smoking status: Never Smoker   . Smokeless tobacco: None  . Alcohol Use: No  . Drug Use: No  . Sexual Activity: Not Asked   Other Topics Concern  . None   Social History Narrative   Review of Systems  Constitutional: Negative.   Gastrointestinal: Negative for nausea, vomiting, abdominal pain, diarrhea and constipation.   Objective:  BP 120/82 mmHg  Pulse 69  Temp(Src) 97.7 F (36.5 C) (Oral)  Ht 5\' 7"  (1.702 m)  Wt 228 lb 2 oz (103.477 kg)  BMI 35.72 kg/m2  SpO2 98%  LMP 10/28/2012  BP/Weight 02/16/2016 01/10/2016 [id number]  Systolic BP 123456 [id number]  Diastolic BP 82 82 84  Wt. (Lbs) 228.13 232 230.5  BMI 35.72 36.33 36.09   Physical Exam  Constitutional: She is oriented to person, place, and time. She appears well-developed. No distress.  Cardiovascular: Normal rate and regular rhythm.     Pulmonary/Chest: Effort normal and breath sounds normal.  Abdominal: Soft. She exhibits no distension and no mass. There is no tenderness. There is no rebound and no guarding.  Neurological: She is alert and oriented to person, place, and time.  Psychiatric: She has a normal mood and affect.  Vitals reviewed.  Lab Results  Component Value Date   WBC 6.9 08/10/2015   HGB 14.8 08/10/2015   HCT 44.8 08/10/2015   PLT 215.0 08/10/2015   GLUCOSE 85 08/10/2015   CHOL 211* 11/12/2012   TRIG 153.0* 11/12/2012   HDL 35.20* 11/12/2012   LDLDIRECT 141.8 11/12/2012   ALT 33 08/10/2015   AST 28 08/10/2015   NA 139 08/10/2015   K 3.8 08/10/2015   CL 103 08/10/2015   CREATININE 0.75 08/10/2015   BUN 15 08/10/2015   CO2 24 08/10/2015   TSH 2.720 08/10/2015   Assessment & Plan:   Problem List Items Addressed This Visit    Other specified symptoms and signs involving the digestive system and abdomen - Primary    New problem. Uncertain etiology/prognosis. Patient reports of an odd sensation in her abdomen. Her exam was benign today. No evidence of acute abdomen, hernia, or other acute pathology. Unlikely to be muscle spasm given lack of pain. Could be a fasciculation? Patient was reassured that this does not appear to be urgent at this acute issue of  the abdomen. Encouraged follow-up with PCP.         Follow-up: PRN  [place]

## 2016-02-16 NOTE — Patient Instructions (Signed)
Your exam was unrevealing.  I'm not sure of the cause of your complaint, but your exam was normal.  Follow up with PCP if persists.  Take care  Dr. Lacinda Axon

## 2016-02-16 NOTE — Assessment & Plan Note (Addendum)
New problem. Uncertain etiology/prognosis. Patient reports of an odd sensation in her abdomen. Her exam was benign today. No evidence of acute abdomen, hernia, or other acute pathology. Unlikely to be muscle spasm given lack of pain. Could be a fasciculation? Patient was reassured that this does not appear to be urgent at this acute issue of the abdomen. Encouraged follow-up with PCP.

## 2016-02-16 NOTE — Progress Notes (Signed)
Pre visit review using our clinic review tool, if applicable. No additional management support is needed unless otherwise documented below in the visit note. 

## 2016-02-23 ENCOUNTER — Encounter (INDEPENDENT_AMBULATORY_CARE_PROVIDER_SITE_OTHER): Payer: Self-pay

## 2016-02-23 ENCOUNTER — Ambulatory Visit (INDEPENDENT_AMBULATORY_CARE_PROVIDER_SITE_OTHER): Payer: BLUE CROSS/BLUE SHIELD | Admitting: Family Medicine

## 2016-02-23 ENCOUNTER — Encounter: Payer: Self-pay | Admitting: Family Medicine

## 2016-02-23 VITALS — BP 108/72 | HR 70 | Temp 98.8°F | Ht 67.0 in | Wt 229.0 lb

## 2016-02-23 DIAGNOSIS — N390 Urinary tract infection, site not specified: Secondary | ICD-10-CM | POA: Insufficient documentation

## 2016-02-23 DIAGNOSIS — R300 Dysuria: Secondary | ICD-10-CM | POA: Diagnosis not present

## 2016-02-23 DIAGNOSIS — N3001 Acute cystitis with hematuria: Secondary | ICD-10-CM

## 2016-02-23 LAB — POCT URINALYSIS DIPSTICK
Bilirubin, UA: NEGATIVE
Glucose, UA: NEGATIVE
Ketones, UA: NEGATIVE
Nitrite, UA: NEGATIVE
Spec Grav, UA: >=1.03
Urobilinogen, UA: 0.2
pH, UA: 6

## 2016-02-23 MED ORDER — CEPHALEXIN 500 MG PO CAPS: 500.0000 mg | ORAL_CAPSULE | Freq: Two times a day (BID) | ORAL | Status: DC

## 2016-02-23 NOTE — Progress Notes (Signed)
Pre visit review using our clinic review tool, if applicable. No additional management support is needed unless otherwise documented below in the visit note. 

## 2016-02-23 NOTE — Progress Notes (Signed)
Patient ID: Kristin Davis, female   DOB: 04/14/1971, 45 y.o.   MRN: [id number]  Tommi Rumps, MD Phone: [phone]  Kristin Davis is a 45 y.o. female who presents today for same-day visit.  Patient notes 2 days of dysuria, urinary urgency, urinary frequency. No fevers. No abdominal pain. No vaginal discharge or itching. This is consistent with her prior UTIs.  ROS see history of present illness  Objective  Physical Exam Filed Vitals:   02/23/16 1612  BP: 108/72  Pulse: 70  Temp: 98.8 F (37.1 C)    Physical Exam  Constitutional: She is well-developed, well-nourished, and in no distress.  Cardiovascular: Normal rate, regular rhythm and normal heart sounds.   Pulmonary/Chest: Effort normal and breath sounds normal.  Abdominal: Soft. Bowel sounds are normal. She exhibits no distension. There is no tenderness. There is no rebound and no guarding.  Skin: She is not diaphoretic.     Assessment/Plan: Please see individual problem list.  UTI (urinary tract infection) Symptoms and UA consistent with UTI. Benign abdominal exam. Stable vital signs. We will treat with Keflex. We'll send urine for culture. Given return precautions.    Orders Placed This Encounter  Procedures  . Urine Culture  . POCT Urinalysis Dipstick    Meds ordered this encounter  Medications  . cephALEXin (KEFLEX) 500 MG capsule    Sig: Take 1 capsule (500 mg total) by mouth 2 (two) times daily.    Dispense:  14 capsule    Refill:  0    Tommi Rumps, MD [place]

## 2016-02-23 NOTE — Assessment & Plan Note (Signed)
Symptoms and UA consistent with UTI. Benign abdominal exam. Stable vital signs. We will treat with Keflex. We'll send urine for culture. Given return precautions.

## 2016-02-23 NOTE — Patient Instructions (Signed)

## 2016-02-26 LAB — URINE CULTURE: Colony Count: >=100000

## 2016-02-27 ENCOUNTER — Telehealth: Payer: Self-pay | Admitting: *Deleted

## 2016-02-27 DIAGNOSIS — M7502 Adhesive capsulitis of left shoulder: Secondary | ICD-10-CM | POA: Diagnosis not present

## 2016-02-27 DIAGNOSIS — M531 Cervicobrachial syndrome: Secondary | ICD-10-CM | POA: Diagnosis not present

## 2016-02-27 DIAGNOSIS — M9901 Segmental and somatic dysfunction of cervical region: Secondary | ICD-10-CM | POA: Diagnosis not present

## 2016-02-27 NOTE — Telephone Encounter (Signed)
Reviewed lab results with patient.  Thanks

## 2016-02-27 NOTE — Telephone Encounter (Signed)
Patient returned a call in reference to lab results  [phone]

## 2016-03-05 DIAGNOSIS — M7502 Adhesive capsulitis of left shoulder: Secondary | ICD-10-CM | POA: Diagnosis not present

## 2016-03-05 DIAGNOSIS — M9901 Segmental and somatic dysfunction of cervical region: Secondary | ICD-10-CM | POA: Diagnosis not present

## 2016-03-05 DIAGNOSIS — M531 Cervicobrachial syndrome: Secondary | ICD-10-CM | POA: Diagnosis not present

## 2016-03-06 ENCOUNTER — Telehealth: Payer: Self-pay | Admitting: *Deleted

## 2016-03-06 NOTE — Telephone Encounter (Signed)
Unfortunately I do not fill antibiotics without evaluation. Particularly given that she should have finished the antibiotic 5 days ago. I would recommend re-evaluation for repeat urine testing.

## 2016-03-06 NOTE — Telephone Encounter (Signed)
Noted  

## 2016-03-06 NOTE — Telephone Encounter (Signed)
Patient has request to have her Cephalexin refill, she was given this antibiotic for a UTi, she stated that its clearing well, but not yet completely cleared.

## 2016-03-06 NOTE — Telephone Encounter (Signed)
Patient wants to hold off on being seen at this time. She stated that symptoms got better for a couple days then they started coming back. She said it feels irritated at this point.

## 2016-03-12 DIAGNOSIS — M531 Cervicobrachial syndrome: Secondary | ICD-10-CM | POA: Diagnosis not present

## 2016-03-12 DIAGNOSIS — M7502 Adhesive capsulitis of left shoulder: Secondary | ICD-10-CM | POA: Diagnosis not present

## 2016-03-12 DIAGNOSIS — M9901 Segmental and somatic dysfunction of cervical region: Secondary | ICD-10-CM | POA: Diagnosis not present

## 2016-03-19 DIAGNOSIS — M531 Cervicobrachial syndrome: Secondary | ICD-10-CM | POA: Diagnosis not present

## 2016-03-19 DIAGNOSIS — M9901 Segmental and somatic dysfunction of cervical region: Secondary | ICD-10-CM | POA: Diagnosis not present

## 2016-03-19 DIAGNOSIS — M7502 Adhesive capsulitis of left shoulder: Secondary | ICD-10-CM | POA: Diagnosis not present

## 2016-04-02 DIAGNOSIS — M7502 Adhesive capsulitis of left shoulder: Secondary | ICD-10-CM | POA: Diagnosis not present

## 2016-04-02 DIAGNOSIS — M531 Cervicobrachial syndrome: Secondary | ICD-10-CM | POA: Diagnosis not present

## 2016-04-02 DIAGNOSIS — M9902 Segmental and somatic dysfunction of thoracic region: Secondary | ICD-10-CM | POA: Diagnosis not present

## 2016-04-26 DIAGNOSIS — M9902 Segmental and somatic dysfunction of thoracic region: Secondary | ICD-10-CM | POA: Diagnosis not present

## 2016-04-26 DIAGNOSIS — M7502 Adhesive capsulitis of left shoulder: Secondary | ICD-10-CM | POA: Diagnosis not present

## 2016-04-26 DIAGNOSIS — M531 Cervicobrachial syndrome: Secondary | ICD-10-CM | POA: Diagnosis not present

## 2016-05-06 DIAGNOSIS — M9902 Segmental and somatic dysfunction of thoracic region: Secondary | ICD-10-CM | POA: Diagnosis not present

## 2016-05-06 DIAGNOSIS — M531 Cervicobrachial syndrome: Secondary | ICD-10-CM | POA: Diagnosis not present

## 2016-06-07 ENCOUNTER — Telehealth: Payer: Self-pay | Admitting: Internal Medicine

## 2016-06-07 NOTE — Telephone Encounter (Signed)
Patient scheduled.

## 2016-06-07 NOTE — Telephone Encounter (Signed)
Pt called stating that she needed to be seen by Dr. Alyssa Grove feeling well. Per Juliann Pulse she needs to know what she means by not feeling well. This will determine how quickly she needs to be seen.

## 2016-06-07 NOTE — Telephone Encounter (Signed)
Pt is feeling very tired and fatigue. Middle to upper back is really hurting. Pt has had low iron in the past and had thyroid issues. By the time 3pm gets here she feels like going to sleep and her back feels like it's going to break.   Call pt @ [phone]. Thank you!

## 2016-06-10 ENCOUNTER — Ambulatory Visit: Payer: BLUE CROSS/BLUE SHIELD | Admitting: Internal Medicine

## 2016-06-10 ENCOUNTER — Ambulatory Visit (INDEPENDENT_AMBULATORY_CARE_PROVIDER_SITE_OTHER): Payer: BLUE CROSS/BLUE SHIELD | Admitting: Internal Medicine

## 2016-06-10 ENCOUNTER — Encounter (INDEPENDENT_AMBULATORY_CARE_PROVIDER_SITE_OTHER): Payer: Self-pay

## 2016-06-10 ENCOUNTER — Other Ambulatory Visit: Payer: BLUE CROSS/BLUE SHIELD

## 2016-06-10 VITALS — BP 112/74 | HR 74 | Temp 98.4°F | Resp 15 | Ht 67.0 in | Wt 224.4 lb

## 2016-06-10 DIAGNOSIS — M791 Myalgia, unspecified site: Secondary | ICD-10-CM

## 2016-06-10 DIAGNOSIS — R5383 Other fatigue: Secondary | ICD-10-CM | POA: Diagnosis not present

## 2016-06-10 DIAGNOSIS — F411 Generalized anxiety disorder: Secondary | ICD-10-CM | POA: Diagnosis not present

## 2016-06-10 DIAGNOSIS — M546 Pain in thoracic spine: Secondary | ICD-10-CM

## 2016-06-10 MED ORDER — DICLOFENAC SODIUM 75 MG PO TBEC: 75.0000 mg | DELAYED_RELEASE_TABLET | Freq: Two times a day (BID) | ORAL | 1 refills | Status: DC

## 2016-06-10 MED ORDER — METHOCARBAMOL 750 MG PO TABS: 750.0000 mg | ORAL_TABLET | Freq: Three times a day (TID) | ORAL | 5 refills | Status: DC | PRN

## 2016-06-10 NOTE — Patient Instructions (Addendum)
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

## 2016-06-10 NOTE — Progress Notes (Signed)
Subjective:  Patient ID: Kristin Davis, female    DOB: [birth year]  Age: 45 y.o. MRN: [id number]  CC: The primary encounter diagnosis was Midline thoracic back pain. Diagnoses of Other fatigue, Muscle pain, and Generalized anxiety disorder were also pertinent to this visit.  HPI Kristin Davis presents for evaluation  of midthoracic back pain for several weeks accompanied by fatigue.  Starts to bother her in early afternoon,  Lately by 4 pm feels like a  knife in the middle of her back ,  For the past 2 weeks  relieved by lying down and raising legs . No diaphoresis , nausea or shortness of breath.  Goes for a walk after it lets up,  Doesn't bother her during the walk .  Completely resolves by morning but recurs daily. Has been walking daily in the evening,  Does not aggravate the pain.  Uses prn tylenol .  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  Tried a new char   New bra.   2) Finds herself  tensing up without realizing it, often when nervous or anxious,  Worries that her vitamin levelss are off.   3) Fatigue , feels washed out after a full day's work,  Just wants to go to sleep when she gets home  Not exercising. Does not snore,  Has had a sleep study 3 years ago for workup of fatigue.   Very anxious afraid something is "off"   Had frozen shoulder in left shoulder Dec 2016.  Did not improve with steroid injection,  And it caused severe anxiety and recurrence of panic attacks. Cannot use her arms to exercise bc of bilateral shoulder pain and has to sleep with arm folded at elbow.  Saw a chiropractor for manipulation .  Afraid of meds,  Tried zoloft in her 91's ,  Made her feel foggy   Struggles with obesity lost 24 lbs in 2015 to 2014 but has regained 10 lb. Does not exercise except for walking on a treadmill and walking in the park several times per week, averaging  2 miles. .    History of anemia in the past last cbc normal in October,   Lab Results  Component Value  Date   WBC 7.1 06/10/2016   HGB 14.5 06/10/2016   HCT 42.9 06/10/2016   MCV 91.3 06/10/2016   PLT 213.0 06/10/2016       Outpatient Medications Prior to Visit  Medication Sig Dispense Refill  . pantoprazole (PROTONIX) 40 MG tablet Take 1 tablet (40 mg total) by mouth daily. 30 tablet 3  . cephALEXin (KEFLEX) 500 MG capsule Take 1 capsule (500 mg total) by mouth 2 (two) times daily. 14 capsule 0   No facility-administered medications prior to visit.     Review of Systems;  Patient denies headache, fevers, malaise, unintentional weight loss, skin rash, eye pain, sinus congestion and sinus pain, sore throat, dysphagia,  hemoptysis , cough, dyspnea, wheezing, chest pain, palpitations, orthopnea, edema, abdominal pain, nausea, melena, diarrhea, constipation, flank pain, dysuria, hematuria, urinary  Frequency, nocturia, numbness, tingling, seizures,  Focal weakness, Loss of consciousness,  Tremor, insomnia, depression, anxiety, and suicidal ideation.      Objective:  BP 112/74   Pulse 74   Temp 98.4 F (36.9 C) (Oral)   Resp 15   Ht 5\' 7"  (1.702 m)   Wt 224 lb 6.4 oz (101.8 kg)   LMP 10/28/2012   SpO2 98%   BMI 35.15 kg/m  BP Readings from Last 3 Encounters:  06/10/16 112/74  02/23/16 108/72  02/16/16 120/82    Wt Readings from Last 3 Encounters:  06/10/16 224 lb 6.4 oz (101.8 kg)  02/23/16 229 lb (103.9 kg)  02/16/16 228 lb 2 oz (103.5 kg)    General appearance: alert, cooperative and appears stated age Ears: normal TM's and external ear canals both ears Throat: lips, mucosa, and tongue normal; teeth and gums normal Neck: no adenopathy, no carotid bruit, supple, symmetrical, trachea midline and thyroid not enlarged, symmetric, no tenderness/mass/nodules Back: symmetric, no curvature. ROM normal. No CVA tenderness. Lungs: clear to auscultation bilaterally Heart: regular rate and rhythm, S1, S2 normal, no murmur, click, rub or gallop Abdomen: soft, non-tender; bowel  sounds normal; no masses,  no organomegaly Pulses: 2+ and symmetric Skin: Skin color, texture, turgor normal. No rashes or lesions Lymph nodes: Cervical, supraclavicular, and axillary nodes normal.  No results found for: HGBA1C  Lab Results  Component Value Date   CREATININE 0.80 06/10/2016   CREATININE 0.75 08/10/2015   CREATININE 0.9 04/12/2014    Lab Results  Component Value Date   WBC 7.1 06/10/2016   HGB 14.5 06/10/2016   HCT 42.9 06/10/2016   PLT 213.0 06/10/2016   GLUCOSE 94 06/10/2016   CHOL 211 (H) 11/12/2012   TRIG 153.0 (H) 11/12/2012   HDL 35.20 (L) 11/12/2012   LDLDIRECT 141.8 11/12/2012   ALT 28 06/10/2016   AST 23 06/10/2016   NA 137 06/10/2016   K 3.8 06/10/2016   CL 103 06/10/2016   CREATININE 0.80 06/10/2016   BUN 14 06/10/2016   CO2 26 06/10/2016   TSH 1.44 06/10/2016    Mm Screening Breast Tomo Bilateral  Result Date: 09/28/2014 CLINICAL DATA:  Screening. EXAM: DIGITAL SCREENING BILATERAL MAMMOGRAM WITH 3D TOMO WITH CAD COMPARISON:  09/21/2013 ACR Breast Density Category b: There are scattered areas of fibroglandular density. FINDINGS: There are no findings suspicious for malignancy. Images were processed with CAD. IMPRESSION: No mammographic evidence of malignancy. A result letter of this screening mammogram will be mailed directly to the patient. RECOMMENDATION: Screening mammogram in one year. (Code:SM-B-01Y) BI-RADS CATEGORY  1: Negative. Electronically Signed   By: Rosemarie Ax   On: 09/28/2014 12:39    Assessment & Plan:   Problem List Items Addressed This Visit    Midline thoracic back pain - Primary    Recurrent occurring daily and resolved with rest and supine position. Reassured patient that angina is an unlikely cause.  EKG normal. Pancreatitis and GB unlikely given  normal enzymes  Thoracic spine films are normal .  History is not suggestive of aortic dissection or aortic aneurysm. Prescribing muscle relaxer and NSAID.  Lab Results    Component Value Date   LIPASE 26.0 06/10/2016   Lab Results  Component Value Date   ALT 28 06/10/2016   AST 23 06/10/2016   ALKPHOS 61 06/10/2016   BILITOT 0.3 06/10/2016         Relevant Medications   diclofenac (VOLTAREN) 75 MG EC tablet   methocarbamol (ROBAXIN-750) 750 MG tablet   Other Relevant Orders   DG Thoracic Spine W/Swimmers (Completed)   Lipase (Completed)   EKG 12-Lead (Completed)   Other fatigue    B12 deficiency and anemia ruled out.  Sleep apnea ruled out previously but may need to have repeat testing. .   Lab Results  Component Value Date   WBC 7.1 06/10/2016   HGB 14.5 06/10/2016   HCT 42.9 06/10/2016  MCV 91.3 06/10/2016   PLT 213.0 06/10/2016   Lab Results  Component Value Date   VITAMINB12 249 06/10/2016         Relevant Orders   Comprehensive metabolic panel (Completed)   TSH (Completed)   CBC with Differential/Platelet (Completed)   EKG 12-Lead (Completed)   B12 (Completed)   Muscle pain   Relevant Orders   Magnesium (Completed)   Generalized anxiety disorder    Recommend starting SSRI therapy for management of anxiety.  No prior trials       Other Visit Diagnoses   None.     I have discontinued Ms. Jeanbaptiste's cephALEXin. I am also having her start on diclofenac and methocarbamol. Additionally, I am having her maintain her pantoprazole.  Meds ordered this encounter  Medications  . diclofenac (VOLTAREN) 75 MG EC tablet    Sig: Take 1 tablet (75 mg total) by mouth 2 (two) times daily.    Dispense:  60 tablet    Refill:  1  . methocarbamol (ROBAXIN-750) 750 MG tablet    Sig: Take 1 tablet (750 mg total) by mouth every 8 (eight) hours as needed for muscle spasms.    Dispense:  60 tablet    Refill:  5    Medications Discontinued During This Encounter  Medication Reason  . cephALEXin (KEFLEX) 500 MG capsule Error    Follow-up: No Follow-up on file.   Crecencio Mc, MD

## 2016-06-11 ENCOUNTER — Other Ambulatory Visit (INDEPENDENT_AMBULATORY_CARE_PROVIDER_SITE_OTHER): Payer: BLUE CROSS/BLUE SHIELD

## 2016-06-11 ENCOUNTER — Ambulatory Visit (INDEPENDENT_AMBULATORY_CARE_PROVIDER_SITE_OTHER): Payer: BLUE CROSS/BLUE SHIELD

## 2016-06-11 DIAGNOSIS — R5383 Other fatigue: Secondary | ICD-10-CM | POA: Diagnosis not present

## 2016-06-11 DIAGNOSIS — M546 Pain in thoracic spine: Secondary | ICD-10-CM

## 2016-06-11 DIAGNOSIS — F411 Generalized anxiety disorder: Secondary | ICD-10-CM | POA: Insufficient documentation

## 2016-06-11 DIAGNOSIS — M791 Myalgia, unspecified site: Secondary | ICD-10-CM | POA: Insufficient documentation

## 2016-06-11 LAB — COMPREHENSIVE METABOLIC PANEL
ALT: 28 U/L (ref 0–35)
AST: 23 U/L (ref 0–37)
Albumin: 4.6 g/dL (ref 3.5–5.2)
Alkaline Phosphatase: 61 U/L (ref 39–117)
BUN: 14 mg/dL (ref 6–23)
CO2: 26 mEq/L (ref 19–32)
Calcium: 9.1 mg/dL (ref 8.4–10.5)
Chloride: 103 mEq/L (ref 96–112)
Creatinine, Ser: 0.8 mg/dL (ref 0.40–1.20)
GFR: 81.59 mL/min (ref >60.00)
Glucose, Bld: 94 mg/dL (ref 70–99)
Potassium: 3.8 mEq/L (ref 3.5–5.1)
Sodium: 137 mEq/L (ref 135–145)
Total Bilirubin: 0.3 mg/dL (ref 0.2–1.2)
Total Protein: 7.6 g/dL (ref 6.0–8.3)

## 2016-06-11 LAB — CBC WITH DIFFERENTIAL/PLATELET
Basophils Absolute: 0 10*3/uL (ref 0.0–0.1)
Basophils Relative: 0.5 % (ref 0.0–3.0)
Eosinophils Absolute: 0.1 10*3/uL (ref 0.0–0.7)
Eosinophils Relative: 1.1 % (ref 0.0–5.0)
HCT: 42.9 % (ref 36.0–46.0)
Hemoglobin: 14.5 g/dL (ref 12.0–15.0)
Lymphocytes Relative: 38.9 % (ref 12.0–46.0)
Lymphs Abs: 2.7 10*3/uL (ref 0.7–4.0)
MCHC: 33.9 g/dL (ref 30.0–36.0)
MCV: 91.3 fl (ref 78.0–100.0)
Monocytes Absolute: 0.6 10*3/uL (ref 0.1–1.0)
Monocytes Relative: 8.6 % (ref 3.0–12.0)
Neutro Abs: 3.6 10*3/uL (ref 1.4–7.7)
Neutrophils Relative %: 50.9 % (ref 43.0–77.0)
Platelets: 213 10*3/uL (ref 150.0–400.0)
RBC: 4.69 Mil/uL (ref 3.87–5.11)
RDW: 13.1 % (ref 11.5–15.5)
WBC: 7.1 10*3/uL (ref 4.0–10.5)

## 2016-06-11 LAB — MAGNESIUM: Magnesium: 2 mg/dL (ref 1.5–2.5)

## 2016-06-11 LAB — VITAMIN B12: Vitamin B-12: 249 pg/mL (ref 211–911)

## 2016-06-11 LAB — LIPASE: Lipase: 26 U/L (ref 11.0–59.0)

## 2016-06-11 LAB — TSH: TSH: 1.44 u[IU]/mL (ref 0.35–4.50)

## 2016-06-11 NOTE — Assessment & Plan Note (Signed)
B12 deficiency and anemia ruled out.  Sleep apnea ruled out previously but may need to have repeat testing. .   Lab Results  Component Value Date   WBC 7.1 06/10/2016   HGB 14.5 06/10/2016   HCT 42.9 06/10/2016   MCV 91.3 06/10/2016   PLT 213.0 06/10/2016   Lab Results  Component Value Date   VITAMINB12 249 06/10/2016

## 2016-06-11 NOTE — Assessment & Plan Note (Signed)
Recommend starting SSRI therapy for management of anxiety.  No prior trials

## 2016-06-11 NOTE — Assessment & Plan Note (Addendum)
Recurrent occurring daily and resolved with rest and supine position. Reassured patient that angina is an unlikely cause.  EKG normal. Pancreatitis and GB unlikely given  normal enzymes  Thoracic spine films are normal .  History is not suggestive of aortic dissection or aortic aneurysm. Prescribing muscle relaxer and NSAID.  Lab Results  Component Value Date   LIPASE 26.0 06/10/2016   Lab Results  Component Value Date   ALT 28 06/10/2016   AST 23 06/10/2016   ALKPHOS 61 06/10/2016   BILITOT 0.3 06/10/2016

## 2016-06-17 ENCOUNTER — Telehealth: Payer: Self-pay | Admitting: *Deleted

## 2016-06-17 NOTE — Telephone Encounter (Signed)
Patient has requested lab results  Pt contact [phone]

## 2016-06-18 NOTE — Telephone Encounter (Signed)
CMA already called patient, thanks

## 2016-06-18 NOTE — Telephone Encounter (Signed)
Patient has not received lab results  Pt contact [phone]

## 2016-06-18 NOTE — Telephone Encounter (Signed)
Spoke with the patient reviewed both labs and xray films. See result note. thanks

## 2016-07-26 DIAGNOSIS — Z23 Encounter for immunization: Secondary | ICD-10-CM | POA: Diagnosis not present

## 2016-10-28 ENCOUNTER — Ambulatory Visit (INDEPENDENT_AMBULATORY_CARE_PROVIDER_SITE_OTHER): Payer: BLUE CROSS/BLUE SHIELD | Admitting: Family

## 2016-10-28 ENCOUNTER — Encounter: Payer: Self-pay | Admitting: Family

## 2016-10-28 VITALS — BP 120/78 | HR 78 | Temp 98.2°F | Ht 67.0 in | Wt 217.8 lb

## 2016-10-28 DIAGNOSIS — R1030 Lower abdominal pain, unspecified: Secondary | ICD-10-CM | POA: Diagnosis not present

## 2016-10-28 DIAGNOSIS — J0120 Acute ethmoidal sinusitis, unspecified: Secondary | ICD-10-CM

## 2016-10-28 DIAGNOSIS — E0590 Thyrotoxicosis, unspecified without thyrotoxic crisis or storm: Secondary | ICD-10-CM | POA: Diagnosis not present

## 2016-10-28 LAB — TSH: TSH: 5.72 u[IU]/mL — ABNORMAL HIGH (ref 0.35–4.50)

## 2016-10-28 MED ORDER — AMOXICILLIN 500 MG PO CAPS: 500.0000 mg | ORAL_CAPSULE | Freq: Two times a day (BID) | ORAL | 0 refills | Status: DC

## 2016-10-28 NOTE — Assessment & Plan Note (Signed)
Patient desires check TSH today. symptomatically stable. Pending TSH. Advised follow up in 6-8 weeks with PCP.

## 2016-10-28 NOTE — Progress Notes (Signed)
Subjective:    Patient ID: Kristin Davis, female    DOB: [birth year], 46 y.o.   MRN: [id number]  CC: Kristin Davis is a 46 y.o. female who presents today for an acute visit.    HPI: CC: sinus pressure, thick foul smelling nasal congestion for 6 weeks and then past week worsening . Endorses ear pain and swollen glands in neck on right side. No fever, SOB, cough. Has been running a humidifier.   Noticed bilateral groin 'ache' 3 days ago, intermittent, unchanged. Notes walking on treadmill more the past week and perhaps aggravated groin pain. . No N, V. No severe abdominal pain. Regular BMs. Worse when walking around. Cannot tell if swollen. No vaginal pain. No change in vaginal discharge.H/o hysterectomy. No h/o diverticulitis.  Has been taking advil with some relief.   H/o of thyroid 'problems' and would like TSH checked today. No Difficulty swallowing, hoarseness. No cold/heat intolerance.        HISTORY:  History reviewed. No pertinent past medical history. Past Surgical History:  Procedure Laterality Date  . ABDOMINAL HYSTERECTOMY  2015   Family History  Problem Relation Age of Onset  . Heart attack Father   . Alcohol abuse Father   . Aortic aneurysm Father   . Cancer Maternal Grandmother     ovarian  . Thyroid disease Mother     Allergies: Patient has no known allergies. Current Outpatient Prescriptions on File Prior to Visit  Medication Sig Dispense Refill  . diclofenac (VOLTAREN) 75 MG EC tablet Take 1 tablet (75 mg total) by mouth 2 (two) times daily. 60 tablet 1  . pantoprazole (PROTONIX) 40 MG tablet Take 1 tablet (40 mg total) by mouth daily. 30 tablet 3   No current facility-administered medications on file prior to visit.     Social History  Substance Use Topics  . Smoking status: Never Smoker  . Smokeless tobacco: Never Used  . Alcohol use No    Review of Systems  Constitutional: Negative for chills and fever.  HENT: Positive for congestion, ear  pain, sinus pain and sinus pressure. Negative for facial swelling, sore throat, trouble swallowing and voice change.   Respiratory: Negative for cough.   Cardiovascular: Negative for chest pain and palpitations.  Gastrointestinal: Negative for abdominal distention, abdominal pain, blood in stool, constipation, diarrhea, nausea, rectal pain and vomiting.  Endocrine: Negative for cold intolerance and heat intolerance.      Objective:    BP 120/78   Pulse 78   Temp 98.2 F (36.8 C) (Oral)   Ht 5\' 7"  (1.702 m)   Wt 217 lb 12.8 oz (98.8 kg)   LMP 10/28/2012   SpO2 99%   BMI 34.11 kg/m    Physical Exam  Constitutional: She appears well-developed and well-nourished.  HENT:  Head: Normocephalic and atraumatic.  Right Ear: Hearing, tympanic membrane, external ear and ear canal normal. No drainage, swelling or tenderness. No foreign bodies. Tympanic membrane is not erythematous and not bulging. No middle ear effusion. No decreased hearing is noted.  Left Ear: Hearing, tympanic membrane, external ear and ear canal normal. No drainage, swelling or tenderness. No foreign bodies. Tympanic membrane is not erythematous and not bulging.  No middle ear effusion. No decreased hearing is noted.  Nose: Nose normal. No rhinorrhea. Right sinus exhibits no maxillary sinus tenderness and no frontal sinus tenderness. Left sinus exhibits no maxillary sinus tenderness and no frontal sinus tenderness.  Mouth/Throat: Uvula is midline, oropharynx is clear and  moist and mucous membranes are normal. No oropharyngeal exudate, posterior oropharyngeal edema, posterior oropharyngeal erythema or tonsillar abscesses.  Eyes: Conjunctivae are normal.  Neck: No thyroid mass and no thyromegaly present.  Cardiovascular: Regular rhythm, normal heart sounds and normal pulses.   Pulmonary/Chest: Effort normal and breath sounds normal. She has no wheezes. She has no rhonchi. She has no rales.  Abdominal: Soft. Normal appearance  and bowel sounds are normal. She exhibits no distension, no fluid wave, no ascites and no mass. There is no tenderness. There is no rigidity, no rebound and no guarding. Hernia confirmed negative in the ventral area, confirmed negative in the right inguinal area and confirmed negative in the left inguinal area.    Mild tenderness noted bilateral groin. No swelling, erythema.   Lymphadenopathy:       Head (right side): No submental, no submandibular, no tonsillar, no preauricular, no posterior auricular and no occipital adenopathy present.       Head (left side): No submental, no submandibular, no tonsillar, no preauricular, no posterior auricular and no occipital adenopathy present.    She has no cervical adenopathy.       Right: No inguinal adenopathy present.       Left: No inguinal adenopathy present.  Neurological: She is alert.  Skin: Skin is warm and dry.  Psychiatric: She has a normal mood and affect. Her speech is normal and behavior is normal. Thought content normal.  Vitals reviewed.      Assessment & Plan:   Problem List Items Addressed This Visit      Respiratory   Acute non-recurrent ethmoidal sinusitis - Primary    Based on duration of symptoms, patient and I jointly agreed to start antibiotic. Return precautions given.      Relevant Medications   amoxicillin (AMOXIL) 500 MG capsule     Endocrine   Hyperthyroidism    Patient desires check TSH today. symptomatically stable. Pending TSH. Advised follow up in 6-8 weeks with PCP.       Relevant Orders   TSH     Other   Inguinal pain    Afebrile. Mild tenderness noted bilaterally on exam. Symptom nonspecific at this time. Patient and I discussed doing imaging including abdominal ultrasound today and patient politely declined imaging at this time. Patient will remain vigilant for symptom and suspects it may have been aggravated recent treadmill use. Patient will let me know if symptom persists, or worsens in any way.              I have discontinued Ms. Elgersma's methocarbamol. I am also having her start on amoxicillin. Additionally, I am having her maintain her pantoprazole and diclofenac.   Meds ordered this encounter  Medications  . amoxicillin (AMOXIL) 500 MG capsule    Sig: Take 1 capsule (500 mg total) by mouth 2 (two) times daily.    Dispense:  14 capsule    Refill:  0    Order Specific Question:   Supervising Provider    Answer:   Crecencio Mc [[id number]]    Return precautions given.   Risks, benefits, and alternatives of the medications and treatment plan prescribed today were discussed, and patient expressed understanding.   Education regarding symptom management and diagnosis given to patient on AVS.  Continue to follow with TULLO, Aris Everts, MD for routine health maintenance.   Kristin Davis and I agreed with plan.   Mable Paris, FNP

## 2016-10-28 NOTE — Assessment & Plan Note (Signed)
Based on duration of symptoms, patient and I jointly agreed to start antibiotic. Return precautions given.

## 2016-10-28 NOTE — Progress Notes (Signed)
Pre visit review using our clinic review tool, if applicable. No additional management support is needed unless otherwise documented below in the visit note. 

## 2016-10-28 NOTE — Assessment & Plan Note (Signed)
Afebrile. Mild tenderness noted bilaterally on exam. Symptom nonspecific at this time. Patient and I discussed doing imaging including abdominal ultrasound today and patient politely declined imaging at this time. Patient will remain vigilant for symptom and suspects it may have been aggravated recent treadmill use. Patient will let me know if symptom persists, or worsens in any way.

## 2016-10-28 NOTE — Patient Instructions (Signed)
Suspect sinus infection  May start mucinex as well  For groin pain, please stay very vigilant. If worsens or new symptoms start, please let me know right away and we can order further testing.   Increase intake of clear fluids. Congestion is best treated by hydration, when mucus is wetter, it is thinner, less sticky, and easier to expel from the body, either through coughing up drainage, or by blowing your nose.   Get plenty of rest.   Use saline nasal drops and blow your nose frequently. Run a humidifier at night and elevate the head of the bed. Vicks Vapor rub will help with congestion and cough. Steam showers and sinus massage for congestion.   Use Acetaminophen or Ibuprofen as needed for fever or pain. Avoid second hand smoke. Even the smallest exposure will worsen symptoms.   This illness will typically last 7 - 10 days.   Please follow up with our clinic if you develop a fever greater than 101 F, symptoms worsen, or do not resolve in the next week.

## 2016-10-30 ENCOUNTER — Telehealth: Payer: Self-pay | Admitting: Family

## 2016-10-30 NOTE — Telephone Encounter (Signed)
Pt can be reached at [phone]

## 2016-10-30 NOTE — Telephone Encounter (Signed)
Left message

## 2016-10-30 NOTE — Telephone Encounter (Signed)
Call pt See mychart message sent regarding lab work; may be under a result note

## 2016-10-30 NOTE — Telephone Encounter (Signed)
I have no idea what you left on message. Please advise.

## 2016-10-30 NOTE — Telephone Encounter (Signed)
Patient was informed of results.  Patient understood and no questions, comments, or concerns at this time.  

## 2016-11-18 ENCOUNTER — Ambulatory Visit (INDEPENDENT_AMBULATORY_CARE_PROVIDER_SITE_OTHER): Payer: BLUE CROSS/BLUE SHIELD | Admitting: Family

## 2016-11-18 ENCOUNTER — Encounter: Payer: Self-pay | Admitting: Family

## 2016-11-18 VITALS — BP 122/66 | HR 73 | Temp 98.1°F | Ht 67.0 in | Wt 221.4 lb

## 2016-11-18 DIAGNOSIS — E039 Hypothyroidism, unspecified: Secondary | ICD-10-CM | POA: Insufficient documentation

## 2016-11-18 LAB — T4, FREE: Free T4: 0.78 ng/dL (ref 0.60–1.60)

## 2016-11-18 LAB — TSH: TSH: 4.48 u[IU]/mL (ref 0.35–4.50)

## 2016-11-18 NOTE — Assessment & Plan Note (Signed)
TSH elevated. Patient has been experiencing cold intolerance, fatigue. Pending thyroid labs. Discussed with patient we may continue to watch TSH until she sees her PCP in couple months for recheck. Patient has a history of TSH elevating and then returning normal.

## 2016-11-18 NOTE — Progress Notes (Signed)
Pre visit review using our clinic review tool, if applicable. No additional management support is needed unless otherwise documented below in the visit note. 

## 2016-11-18 NOTE — Progress Notes (Signed)
Subjective:    Patient ID: Kristin Davis, female    DOB: 20-Oct-1971, 46 y.o.   MRN: [id number]  CC: Kristin Davis is a 46 y.o. female who presents today for follow up.   HPI: Started b12 complex and magneiusm since last visit.  Had seen endocrine in the past. Does note fatigue, extremely dry skin.     HISTORY:  No past medical history on file. Past Surgical History:  Procedure Laterality Date  . ABDOMINAL HYSTERECTOMY  2015   Family History  Problem Relation Age of Onset  . Heart attack Father   . Alcohol abuse Father   . Aortic aneurysm Father   . Cancer Maternal Grandmother     ovarian  . Thyroid disease Mother     Allergies: Patient has no known allergies. Current Outpatient Prescriptions on File Prior to Visit  Medication Sig Dispense Refill  . diclofenac (VOLTAREN) 75 MG EC tablet Take 1 tablet (75 mg total) by mouth 2 (two) times daily. 60 tablet 1   No current facility-administered medications on file prior to visit.     Social History  Substance Use Topics  . Smoking status: Never Smoker  . Smokeless tobacco: Never Used  . Alcohol use No    Review of Systems  Constitutional: Negative for chills and fever.  Respiratory: Negative for cough.   Cardiovascular: Negative for chest pain and palpitations.  Gastrointestinal: Negative for nausea and vomiting.  Endocrine: Positive for cold intolerance. Negative for heat intolerance.      Objective:    BP 122/66   Pulse 73   Temp 98.1 F (36.7 C) (Oral)   Ht 5\' 7"  (1.702 m)   Wt 221 lb 6.4 oz (100.4 kg)   LMP 10/28/2012   SpO2 98%   BMI 34.68 kg/m  BP Readings from Last 3 Encounters:  11/18/16 122/66  10/28/16 120/78  06/10/16 112/74   Wt Readings from Last 3 Encounters:  11/18/16 221 lb 6.4 oz (100.4 kg)  10/28/16 217 lb 12.8 oz (98.8 kg)  06/10/16 224 lb 6.4 oz (101.8 kg)    Physical Exam  Constitutional: She appears well-developed and well-nourished.  Eyes: Conjunctivae are normal.    Neck: No thyroid mass and no thyromegaly present.  Cardiovascular: Normal rate, regular rhythm, normal heart sounds and normal pulses.   Pulmonary/Chest: Effort normal and breath sounds normal. She has no wheezes. She has no rhonchi. She has no rales.  Lymphadenopathy:       Head (right side): No submental, no submandibular, no tonsillar, no preauricular, no posterior auricular and no occipital adenopathy present.       Head (left side): No submental, no submandibular, no tonsillar, no preauricular, no posterior auricular and no occipital adenopathy present.    She has no cervical adenopathy.  Neurological: She is alert.  Skin: Skin is warm and dry.  Psychiatric: She has a normal mood and affect. Her speech is normal and behavior is normal. Thought content normal.  Vitals reviewed.      Assessment & Plan:   Problem List Items Addressed This Visit      Endocrine   Hypothyroidism - Primary    TSH elevated. Patient has been experiencing cold intolerance, fatigue. Pending thyroid labs. Discussed with patient we may continue to watch TSH until she sees her PCP in couple months for recheck. Patient has a history of TSH elevating and then returning normal.      Relevant Orders   T4, free   Thyrotropin  receptor autoabs   TSH       I have discontinued Ms. Reason's pantoprazole and amoxicillin. I am also having her maintain her diclofenac.   No orders of the defined types were placed in this encounter.   Return precautions given.   Risks, benefits, and alternatives of the medications and treatment plan prescribed today were discussed, and patient expressed understanding.   Education regarding symptom management and diagnosis given to patient on AVS.  Continue to follow with TULLO, Aris Everts, MD for routine health maintenance.   Kristin Davis and I agreed with plan.   Mable Paris, FNP

## 2016-11-18 NOTE — Patient Instructions (Signed)
Labs today Pleasure seeing you   

## 2016-11-19 ENCOUNTER — Other Ambulatory Visit: Payer: Self-pay | Admitting: Internal Medicine

## 2016-11-19 DIAGNOSIS — Z1231 Encounter for screening mammogram for malignant neoplasm of breast: Secondary | ICD-10-CM

## 2016-11-20 LAB — THYROTROPIN RECEPTOR AUTOABS: Thyrotropin Receptor Ab: <6 % (ref <=16.0)

## 2016-12-19 ENCOUNTER — Ambulatory Visit
Admission: RE | Admit: 2016-12-19 | Discharge: 2016-12-19 | Disposition: A | Discharge status: Home / Self Care | Payer: BLUE CROSS/BLUE SHIELD | Source: Ambulatory Visit | Attending: Internal Medicine | Admitting: Internal Medicine

## 2016-12-19 DIAGNOSIS — Z1231 Encounter for screening mammogram for malignant neoplasm of breast: Secondary | ICD-10-CM

## 2017-01-26 ENCOUNTER — Encounter: Payer: Self-pay | Admitting: Gynecology

## 2017-01-26 ENCOUNTER — Ambulatory Visit
Admission: EM | Admit: 2017-01-26 | Discharge: 2017-01-26 | Disposition: A | Discharge status: Home / Self Care | Payer: BLUE CROSS/BLUE SHIELD | Attending: Emergency Medicine | Admitting: Emergency Medicine

## 2017-01-26 DIAGNOSIS — N390 Urinary tract infection, site not specified: Secondary | ICD-10-CM | POA: Diagnosis not present

## 2017-01-26 DIAGNOSIS — J029 Acute pharyngitis, unspecified: Secondary | ICD-10-CM

## 2017-01-26 DIAGNOSIS — J069 Acute upper respiratory infection, unspecified: Secondary | ICD-10-CM | POA: Diagnosis not present

## 2017-01-26 LAB — URINALYSIS, COMPLETE (UACMP) WITH MICROSCOPIC
Bilirubin Urine: NEGATIVE
Glucose, UA: NEGATIVE mg/dL
Ketones, ur: 15 mg/dL — AB
Nitrite: NEGATIVE
Protein, ur: 100 mg/dL — AB
Specific Gravity, Urine: 1.015 (ref 1.005–1.030)
pH: 5.5 (ref 5.0–8.0)

## 2017-01-26 LAB — RAPID STREP SCREEN (MED CTR MEBANE ONLY): Streptococcus, Group A Screen (Direct): NEGATIVE

## 2017-01-26 MED ORDER — CEPHALEXIN 500 MG PO CAPS: 500.0000 mg | ORAL_CAPSULE | Freq: Two times a day (BID) | ORAL | 0 refills | Status: DC

## 2017-01-26 MED ORDER — PHENAZOPYRIDINE HCL 200 MG PO TABS: 200.0000 mg | ORAL_TABLET | Freq: Three times a day (TID) | ORAL | 0 refills | Status: DC

## 2017-01-26 NOTE — ED Provider Notes (Signed)
CSN: [id number]     Arrival date & time 01/26/17  0802 History   None    Chief Complaint  Patient presents with  . Urinary Tract Infection  . Sore Throat  . Otalgia   (Consider location/radiation/quality/duration/timing/severity/associated sxs/prior Treatment) HPI  46 year old female who presents with 2 problems.  First problem is that of a possible urinary tract infection. That she is having frequent a full urination that is burning in nature. He is having urgency and in satiety. Has noticed some blood on the toilet tissue when she wiped herself. Denies any fever or chills. She's been having no back pain. Her pressure today is 99.2 and denies a vaginal discharge.   Problem is that of a sore throat and right ear pain and pressure she's had for over week. He states that today is actually feeling much better. Had cold symptoms to begin with in and has mostly had sore throat and ear pain since then. 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      History reviewed. No pertinent past medical history. Past Surgical History:  Procedure Laterality Date  . ABDOMINAL HYSTERECTOMY  2015  . BREAST BIOPSY Right 09/30/2013   benign   Family History  Problem Relation Age of Onset  . Heart attack Father   . Alcohol abuse Father   . Aortic aneurysm Father   . Cancer Maternal Grandmother     ovarian  . Thyroid disease Mother    Social History  Substance Use Topics  . Smoking status: Never Smoker  . Smokeless tobacco: Never Used  . Alcohol use No   OB History    No data available     Review of Systems  Constitutional: Positive for activity change. Negative for appetite change, chills, fatigue and fever.  HENT: Positive for congestion, ear pain, postnasal drip, rhinorrhea, sinus pressure and sore throat.   Genitourinary: Positive for dysuria, frequency, hematuria and urgency. Negative for vaginal discharge.  All other systems reviewed and are  negative.   Allergies  Patient has no known allergies.  Home Medications   Prior to Admission medications   Medication Sig Start Date End Date Taking? Authorizing Provider  cephALEXin (KEFLEX) 500 MG capsule Take 1 capsule (500 mg total) by mouth 2 (two) times daily. 01/26/17   Lorin Picket, PA-C  diclofenac (VOLTAREN) 75 MG EC tablet Take 1 tablet (75 mg total) by mouth 2 (two) times daily. 06/10/16   Crecencio Mc, MD  phenazopyridine (PYRIDIUM) 200 MG tablet Take 1 tablet (200 mg total) by mouth 3 (three) times daily. 01/26/17   Lorin Picket, PA-C   Meds Ordered and Administered this Visit  Medications - No data to display  BP 115/84 (BP Location: Left Arm)   Pulse 89   Temp 99.2 F (37.3 C) (Oral)   Resp 16   Ht 5\' 7"  (1.702 m)   Wt 220 lb (99.8 kg)   LMP 10/28/2012   SpO2 98%   BMI 34.46 kg/m  No data found.   Physical Exam  Constitutional: She is oriented to person, place, and time. She appears well-developed and well-nourished. No distress.  HENT:  Head: Normocephalic and atraumatic.  Right Ear: External ear normal.  Left Ear: External ear normal.  Nose: Nose normal.  Mouth/Throat: Oropharynx is clear and moist. No oropharyngeal exudate.  Eyes: EOM are normal. Pupils are equal, round, and reactive to light. Right eye exhibits no discharge. Left eye exhibits  no discharge.  Neck: Normal range of motion.  Pulmonary/Chest: Effort normal and breath sounds normal.  Musculoskeletal: Normal range of motion.  Lymphadenopathy:    She has no cervical adenopathy.  Neurological: She is alert and oriented to person, place, and time.  Skin: Skin is warm and dry. She is not diaphoretic.  Psychiatric: She has a normal mood and affect. Her behavior is normal. Judgment and thought content normal.  Nursing note and vitals reviewed.   Urgent Care Course     Procedures (including critical care time)  Labs Review Labs Reviewed  URINALYSIS, COMPLETE (UACMP) WITH  MICROSCOPIC - Abnormal; Notable for the following:       Result Value   APPearance CLOUDY (*)    Hgb urine dipstick LARGE (*)    Ketones, ur 15 (*)    Protein, ur 100 (*)    Leukocytes, UA MODERATE (*)    Squamous Epithelial / LPF 0-5 (*)    Bacteria, UA FEW (*)    All other components within normal limits  RAPID STREP SCREEN (NOT AT [facility])  CULTURE, GROUP A STREP [facility])    Imaging Review No results found.   Visual Acuity Review  Right Eye Distance:   Left Eye Distance:   Bilateral Distance:    Right Eye Near:   Left Eye Near:    Bilateral Near:         MDM   1. Upper respiratory tract infection, unspecified type   2. Lower urinary tract infectious disease    Discharge Medication List as of 01/26/2017  8:59 AM    START taking these medications   Details  cephALEXin (KEFLEX) 500 MG capsule Take 1 capsule (500 mg total) by mouth 2 (two) times daily., Starting Sun 01/26/2017, Normal    phenazopyridine (PYRIDIUM) 200 MG tablet Take 1 tablet (200 mg total) by mouth 3 (three) times daily., Starting Sun 01/26/2017, Normal      Plan: 1. Test/x-ray results and diagnosis reviewed with patient 2. rx as per orders; risks, benefits, potential side effects reviewed with patient 3. Recommend supportive treatment with Increased fluid intake. For her upper respiratory symptoms she can use a salt water gargles ad lib. I've also recommended Flonase and Zyrtec or Allegra for her seasonal allergies to ragweed. Urine culture will be available in 48 hours. 4. F/u prn if symptoms worsen or don't improve     Lorin Picket, PA-C 01/26/17 [phone]

## 2017-01-26 NOTE — ED Triage Notes (Signed)
Patient c/o frequent / painful and blood in urine x 3 days. Per patient sore throat and bilateral ear pain.

## 2017-01-29 ENCOUNTER — Ambulatory Visit: Payer: BLUE CROSS/BLUE SHIELD | Admitting: Internal Medicine

## 2017-01-29 LAB — CULTURE, GROUP A STREP (THRC)

## 2017-02-03 DIAGNOSIS — D485 Neoplasm of uncertain behavior of skin: Secondary | ICD-10-CM | POA: Diagnosis not present

## 2017-02-03 DIAGNOSIS — L739 Follicular disorder, unspecified: Secondary | ICD-10-CM | POA: Diagnosis not present

## 2017-02-03 DIAGNOSIS — L810 Postinflammatory hyperpigmentation: Secondary | ICD-10-CM | POA: Diagnosis not present

## 2017-03-18 DIAGNOSIS — H5203 Hypermetropia, bilateral: Secondary | ICD-10-CM | POA: Diagnosis not present

## 2017-08-20 IMAGING — DX DG THORACIC SPINE 3V
3 series · 3 of 3 positions shown · non-contrast
Comparison: None in PACs

CLINICAL DATA: Daily mid thoracic pain, sharp sensation, no report
of injury.

EXAM:
THORACIC SPINE - 3 VIEWS

[thoracic spine ap]
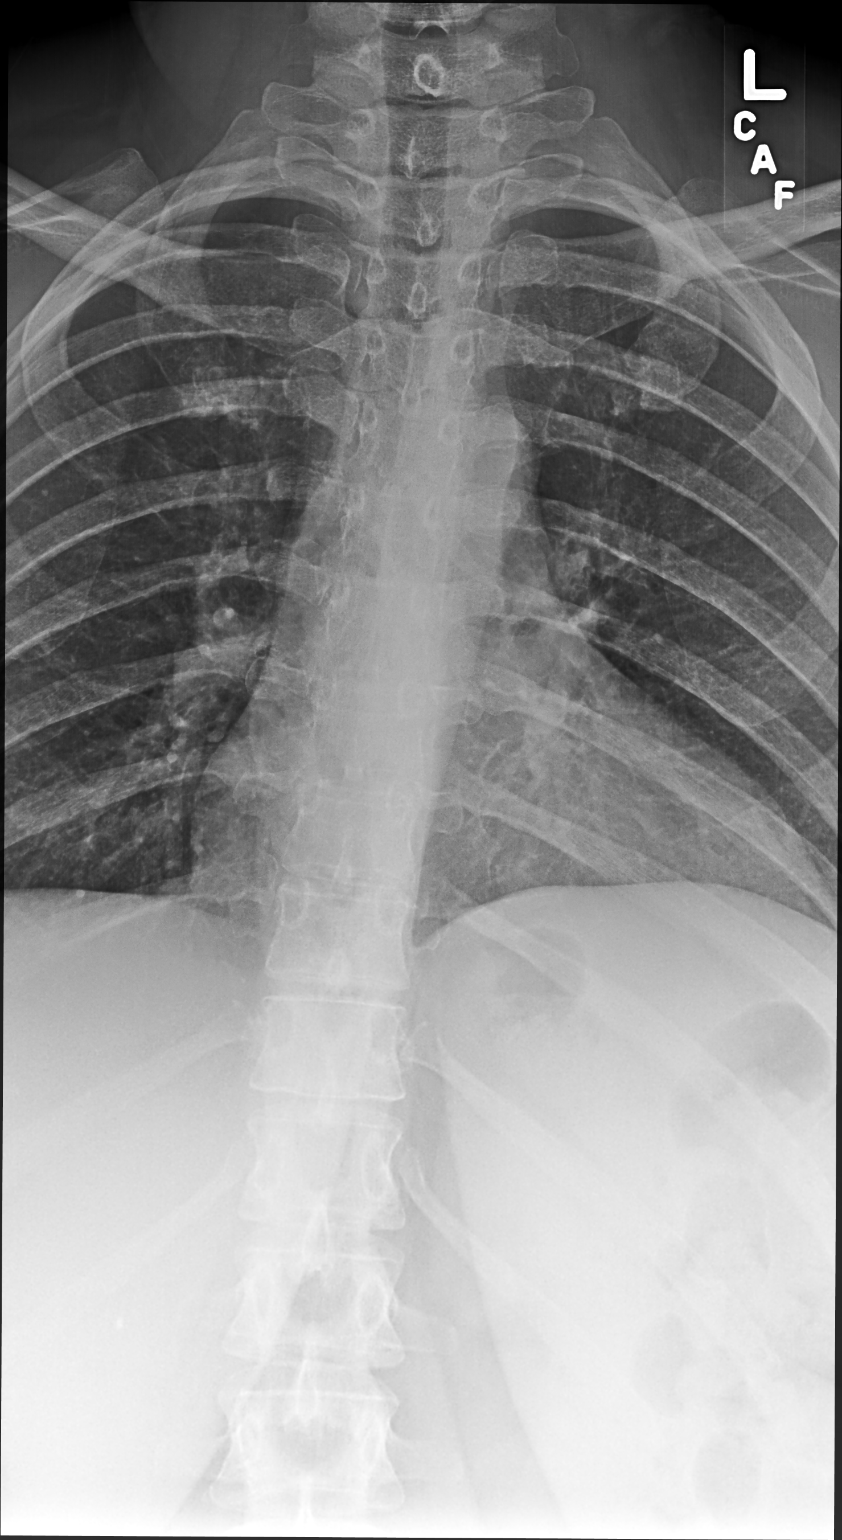

[thoracic spine lat]
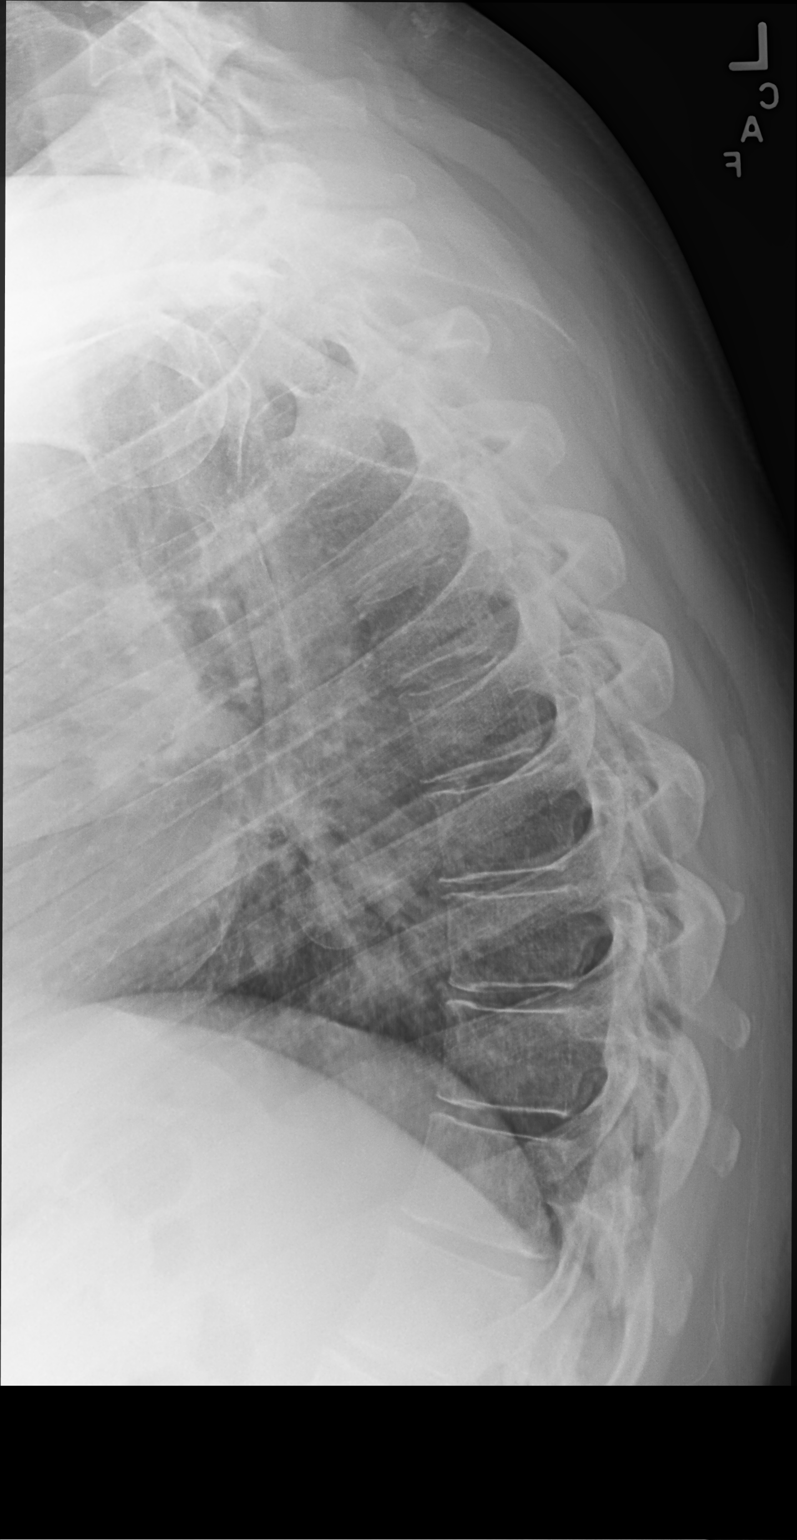

[swimmers lat]
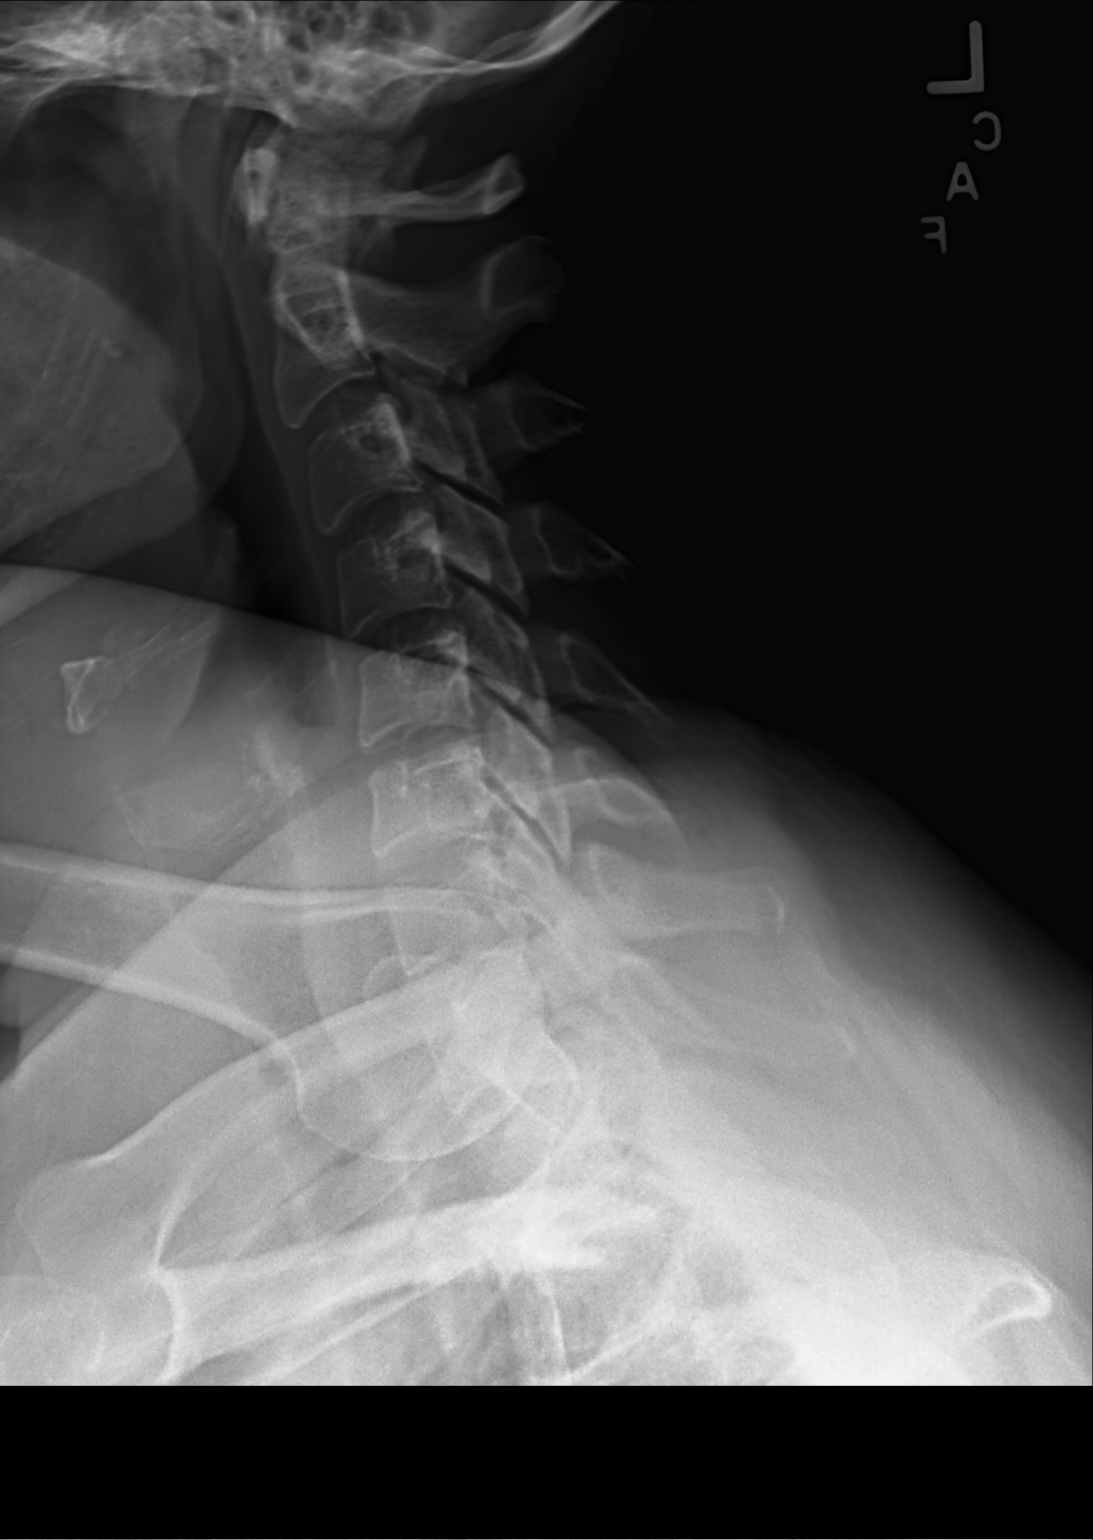

[3 of 3 positions shown; findings below may reference images not displayed]

FINDINGS: The thoracic vertebral bodies are preserved in height. The pedicles
are intact. There is no abnormal paravertebral soft tissue density.
There is mild dextrocurvature centered at approximately L1-2 which
involves both the thoracic and lumbar spine. The cervicothoracic
junction appears normal.
IMPRESSION: There is no acute or significant chronic degenerative change of the
thoracic spine. There is mild dextrocurvature of the thoracolumbar
spine.

## 2017-12-15 ENCOUNTER — Ambulatory Visit: Payer: Self-pay | Admitting: Hematology

## 2017-12-15 NOTE — Telephone Encounter (Signed)
Last Friday while driving, she experienced pain in her throat with a burning sensation, and a hot feeling.  Was able to go to gym, she sat down.  The pain radiated down into her chest.  No sharp stabbing pain.  Pain improved once she calmed down.  She was able to drink water, and workout.  No other symptoms.  She does C/O hot feeling, but no other symptoms. Hx: hyperthyroid.  Patient is not sure if she is experiencing issues from her thyroid or anxiety.  Patient also states she has changed her diet and has changed to a low carb diet.  I did explain that sometimes low blood sugar can present like an anxious or nervous feeling.  Instructed patient to keep water and a high protein snack available . Patient only wanted to see Dr. Derrel Nip.  Appointment already made for 12/23/2017 at 2:00  Reason for Disposition . [1] Sore throat is the only symptom AND [2] present > 48 hours  Answer Assessment - Initial Assessment Questions 1. ONSET: "When did the throat start hurting?" (Hours or days ago)      Friday 12/12/2017 2. SEVERITY: "How bad is the sore throat?" (Scale 1-10; mild, moderate or severe)   - MODERATE (4-7): interferes with eating some solids and normal activities   4.  VIRAL SYMPTOMS: "Are there any symptoms of a cold, such as a runny nose, cough, hoarse voice or red eyes?"  No 5. FEVER: "Do you have a fever?" If so, ask: "What is your temperature, how was it measured, and when did it start?" NO 6. PUS ON THE TONSILS: "Is there pus on the tonsils in the back of your throat?"  No 7. OTHER SYMPTOMS: "Do you have any other symptoms?" (e.g., difficulty breathing, headache, rash)    No 8. PREGNANCY: "Is there any chance you are pregnant?" "When was your last menstrual period?"    NO  Protocols used: SORE THROAT-A-AH

## 2017-12-23 ENCOUNTER — Ambulatory Visit: Payer: BLUE CROSS/BLUE SHIELD | Admitting: Internal Medicine

## 2017-12-23 ENCOUNTER — Encounter: Payer: Self-pay | Admitting: Internal Medicine

## 2017-12-23 VITALS — BP 102/68 | HR 64 | Temp 97.7°F | Resp 14 | Ht 67.0 in | Wt 224.6 lb

## 2017-12-23 DIAGNOSIS — E0590 Thyrotoxicosis, unspecified without thyrotoxic crisis or storm: Secondary | ICD-10-CM

## 2017-12-23 DIAGNOSIS — K209 Esophagitis, unspecified without bleeding: Secondary | ICD-10-CM

## 2017-12-23 DIAGNOSIS — L659 Nonscarring hair loss, unspecified: Secondary | ICD-10-CM | POA: Diagnosis not present

## 2017-12-23 DIAGNOSIS — R635 Abnormal weight gain: Secondary | ICD-10-CM | POA: Diagnosis not present

## 2017-12-23 DIAGNOSIS — E039 Hypothyroidism, unspecified: Secondary | ICD-10-CM

## 2017-12-23 LAB — COMPREHENSIVE METABOLIC PANEL
ALT: 28 U/L (ref 0–35)
AST: 21 U/L (ref 0–37)
Albumin: 4.4 g/dL (ref 3.5–5.2)
Alkaline Phosphatase: 50 U/L (ref 39–117)
BUN: 20 mg/dL (ref 6–23)
CO2: 25 mEq/L (ref 19–32)
Calcium: 9.7 mg/dL (ref 8.4–10.5)
Chloride: 101 mEq/L (ref 96–112)
Creatinine, Ser: 0.85 mg/dL (ref 0.40–1.20)
GFR: 75.58 mL/min (ref >60.00)
Glucose, Bld: 90 mg/dL (ref 70–99)
Potassium: 3.6 mEq/L (ref 3.5–5.1)
Sodium: 135 mEq/L (ref 135–145)
Total Bilirubin: 0.4 mg/dL (ref 0.2–1.2)
Total Protein: 7.8 g/dL (ref 6.0–8.3)

## 2017-12-23 LAB — TSH: TSH: 2.58 u[IU]/mL (ref 0.35–4.50)

## 2017-12-23 LAB — T4, FREE: Free T4: 0.9 ng/dL (ref 0.60–1.60)

## 2017-12-23 NOTE — Patient Instructions (Addendum)
For the insomnia:  Try Melatonin up to 5 mg at night for insomnia  Take it around 7 pm      Trial of GERD medication ,  I think your throat pain may hae come from an esophageal spasm:  Omeprazole 20 mg  (strong medicine : may interfere with nutrients getting absorbed)   Famotidine 20 mg (pepcid) every 12 hours if needed   Ranitidine 150 (Zantrac) every 12 hours if needed    For the weight loss:  Consider "intermittent fasting "  ( no food  between 7 pm and 11 am daily)

## 2017-12-23 NOTE — Progress Notes (Signed)
Subjective:  Patient ID: [name], female    DOB: Aug 31, 1971  Age: 47 y.o. MRN: [id number]  CC: The primary encounter diagnosis was Hair loss. Diagnoses of Weight gain, Hyperthyroidism, Hypothyroidism, unspecified type, and Esophagitis, unspecified were also pertinent to this visit.  HPI MILCAH DULANY presents for evaluation of  Sudden onset of throat pain that radiated into chest.   Occurred while driving down the road  And subsided after a few minutes and did not recur with intensive exercise right afterward.  Developed a hot flash that lasted 5 minutes.   Difficulty losing weight. has been participating in  Exercise (dance ,aerobics ) workouts 5 days per week. Since early February . Lost 3 lbs the first week,  None since then. Clothes are becoming looser.  Skin dry,   Losing hair but not weight. Staying on a low carb diet. Eating vegetables,  No refined sugar,  No starchy vegetables. Fish and chicken for animal proteins.  Drinking a gallon of water.  No leg cramps.  Bowels  moving every other day.  No straining .  No alcohol.  No tea.  Rare 2% milk once per week.   Lab Results  Component Value Date   TSH 2.58 12/23/2017    Outpatient Medications Prior to Visit  Medication Sig Dispense Refill  . diclofenac (VOLTAREN) 75 MG EC tablet Take 1 tablet (75 mg total) by mouth 2 (two) times daily. (Patient not taking: Reported on 12/23/2017) 60 tablet 1  . cephALEXin (KEFLEX) 500 MG capsule Take 1 capsule (500 mg total) by mouth 2 (two) times daily. (Patient not taking: Reported on 12/23/2017) 10 capsule 0  . phenazopyridine (PYRIDIUM) 200 MG tablet Take 1 tablet (200 mg total) by mouth 3 (three) times daily. (Patient not taking: Reported on 12/23/2017) 6 tablet 0   No facility-administered medications prior to visit.     Review of Systems;  Patient denies headache, fevers, malaise, unintentional weight loss, skin rash, eye pain, sinus congestion and sinus pain, sore throat, dysphagia,   hemoptysis , cough, dyspnea, wheezing, chest pain, palpitations, orthopnea, edema, abdominal pain, nausea, melena, diarrhea, constipation, flank pain, dysuria, hematuria, urinary  Frequency, nocturia, numbness, tingling, seizures,  Focal weakness, Loss of consciousness,  Tremor, insomnia, depression, anxiety, and suicidal ideation.      Objective:  BP 102/68 (BP Location: Left Arm, Patient Position: Sitting, Cuff Size: Large)   Pulse 64   Temp 97.7 F (36.5 C) (Oral)   Resp 14   Ht 5\' 7"  (1.702 m)   Wt 224 lb 9.6 oz (101.9 kg)   LMP 10/28/2012   SpO2 97%   BMI 35.18 kg/m   BP Readings from Last 3 Encounters:  12/23/17 102/68  01/26/17 115/84  11/18/16 122/66    Wt Readings from Last 3 Encounters:  12/23/17 224 lb 9.6 oz (101.9 kg)  01/26/17 220 lb (99.8 kg)  11/18/16 221 lb 6.4 oz (100.4 kg)    General appearance: alert, cooperative and appears stated age Ears: normal TM's and external ear canals both ears Throat: lips, mucosa, and tongue normal; teeth and gums normal Neck: no adenopathy, no carotid bruit, supple, symmetrical, trachea midline and thyroid not enlarged, symmetric, no tenderness/mass/nodules Back: symmetric, no curvature. ROM normal. No CVA tenderness. Lungs: clear to auscultation bilaterally Heart: regular rate and rhythm, S1, S2 normal, no murmur, click, rub or gallop Abdomen: soft, non-tender; bowel sounds normal; no masses,  no organomegaly Pulses: 2+ and symmetric Skin: Skin color, texture, turgor normal. No  rashes or lesions Lymph nodes: Cervical, supraclavicular, and axillary nodes normal.  No results found for: HGBA1C  Lab Results  Component Value Date   CREATININE 0.85 12/23/2017   CREATININE 0.80 06/10/2016   CREATININE 0.75 08/10/2015    Lab Results  Component Value Date   WBC 7.1 06/10/2016   HGB 14.5 06/10/2016   HCT 42.9 06/10/2016   PLT 213.0 06/10/2016   GLUCOSE 90 12/23/2017   CHOL 211 (H) 11/12/2012   TRIG 153.0 (H) 11/12/2012     HDL 35.20 (L) 11/12/2012   LDLDIRECT 141.8 11/12/2012   ALT 28 12/23/2017   AST 21 12/23/2017   NA 135 12/23/2017   K 3.6 12/23/2017   CL 101 12/23/2017   CREATININE 0.85 12/23/2017   BUN 20 12/23/2017   CO2 25 12/23/2017   TSH 2.58 12/23/2017    No results found.  Assessment & Plan:   Problem List Items Addressed This Visit    Esophagitis, unspecified    With recent episode of esophageal spasm. Resume PPI      Hyperthyroidism    Repeat assessment of thyroid function with TSH and T4 is normal  Lab Results  Component Value Date   TSH 2.58 12/23/2017         Hypothyroidism    Suggested by symptoms but not supported by labs today.  Likely due to radically different lifestyle changes including diet and exercise        Other Visit Diagnoses    Hair loss    -  Primary   Relevant Orders   Comprehensive metabolic panel (Completed)   Weight gain       Relevant Orders   Thyroid peroxidase antibody (Completed)   TSH (Completed)   T4, free (Completed)     A total of 25 minutes of face to face time was spent with patient more than half of which was spent in counselling about the above mentioned conditions  and coordination of care  I have discontinued Melania A. Danh's cephALEXin and phenazopyridine. I am also having her maintain her diclofenac.  No orders of the defined types were placed in this encounter.   Medications Discontinued During This Encounter  Medication Reason  . cephALEXin (KEFLEX) 500 MG capsule Completed Course  . phenazopyridine (PYRIDIUM) 200 MG tablet Patient has not taken in last 30 days    Follow-up: No Follow-up on file.   Crecencio Mc, MD

## 2017-12-23 NOTE — Progress Notes (Signed)
Subjective:  Patient ID: Kristin Davis, female    DOB: 25-Oct-1970  Age: 47 y.o. MRN: [id number]  CC: There were no encounter diagnoses.  HPI Kristin Davis presents for evaluation of  Sudden onset of throat pain that radiated into chest.   Occurred while driving down the road  And subsided after a few minutes and did not recur with intensive exercise right afterward.  Developed a hot flash that lasted 5 minutes.   Has been participating in  Exercise (dance ,aerobics ) workouts 5 days per week. Since early February . Lost 3 lbs the first week,  None since then. Clothes are becoming looser.  Skin dry,   Losing hair but not weight. Staying on a low carb diet. Eating vegetables,  No refined sugar,  No starchy vegetables. Fish and chicken for animal proteins.  Drinking a gallon of water.  No leg cramps.  Bowels  moving every other day.  No straining .  No alcohol.  No tea.  Rare 2% milk once per week.   Lab Results  Component Value Date   TSH 4.48 11/18/2016    Outpatient Medications Prior to Visit  Medication Sig Dispense Refill  . cephALEXin (KEFLEX) 500 MG capsule Take 1 capsule (500 mg total) by mouth 2 (two) times daily. 10 capsule 0  . diclofenac (VOLTAREN) 75 MG EC tablet Take 1 tablet (75 mg total) by mouth 2 (two) times daily. 60 tablet 1  . phenazopyridine (PYRIDIUM) 200 MG tablet Take 1 tablet (200 mg total) by mouth 3 (three) times daily. 6 tablet 0   No facility-administered medications prior to visit.     Review of Systems;  Patient denies headache, fevers, malaise, unintentional weight loss, skin rash, eye pain, sinus congestion and sinus pain, sore throat, dysphagia,  hemoptysis , cough, dyspnea, wheezing, chest pain, palpitations, orthopnea, edema, abdominal pain, nausea, melena, diarrhea, constipation, flank pain, dysuria, hematuria, urinary  Frequency, nocturia, numbness, tingling, seizures,  Focal weakness, Loss of consciousness,  Tremor, insomnia, depression, anxiety,  and suicidal ideation.      Objective:  LMP 10/28/2012   BP Readings from Last 3 Encounters:  01/26/17 115/84  11/18/16 122/66  10/28/16 120/78    Wt Readings from Last 3 Encounters:  01/26/17 220 lb (99.8 kg)  11/18/16 221 lb 6.4 oz (100.4 kg)  10/28/16 217 lb 12.8 oz (98.8 kg)    General appearance: alert, cooperative and appears stated age Ears: normal TM's and external ear canals both ears Throat: lips, mucosa, and tongue normal; teeth and gums normal Neck: no adenopathy, no carotid bruit, supple, symmetrical, trachea midline and thyroid not enlarged, symmetric, no tenderness/mass/nodules Back: symmetric, no curvature. ROM normal. No CVA tenderness. Lungs: clear to auscultation bilaterally Heart: regular rate and rhythm, S1, S2 normal, no murmur, click, rub or gallop Abdomen: soft, non-tender; bowel sounds normal; no masses,  no organomegaly Pulses: 2+ and symmetric Skin: Skin color, texture, turgor normal. No rashes or lesions Lymph nodes: Cervical, supraclavicular, and axillary nodes normal.  No results found for: HGBA1C  Lab Results  Component Value Date   CREATININE 0.80 06/10/2016   CREATININE 0.75 08/10/2015   CREATININE 0.9 04/12/2014    Lab Results  Component Value Date   WBC 7.1 06/10/2016   HGB 14.5 06/10/2016   HCT 42.9 06/10/2016   PLT 213.0 06/10/2016   GLUCOSE 94 06/10/2016   CHOL 211 (H) 11/12/2012   TRIG 153.0 (H) 11/12/2012   HDL 35.20 (L) 11/12/2012   LDLDIRECT 141.8  11/12/2012   ALT 28 06/10/2016   AST 23 06/10/2016   NA 137 06/10/2016   K 3.8 06/10/2016   CL 103 06/10/2016   CREATININE 0.80 06/10/2016   BUN 14 06/10/2016   CO2 26 06/10/2016   TSH 4.48 11/18/2016    No results found.  Assessment & Plan:   Problem List Items Addressed This Visit    None      I am having Kristin Davis maintain her diclofenac, cephALEXin, and phenazopyridine.  No orders of the defined types were placed in this encounter.   There are no  discontinued medications.  Follow-up: No Follow-up on file.   Crecencio Mc, MD

## 2017-12-24 LAB — THYROID PEROXIDASE ANTIBODY: Thyroperoxidase Ab SerPl-aCnc: 59 IU/mL — ABNORMAL HIGH (ref <9)

## 2017-12-24 NOTE — Assessment & Plan Note (Signed)
Repeat assessment of thyroid function with TSH and T4 is normal  Lab Results  Component Value Date   TSH 2.58 12/23/2017

## 2017-12-24 NOTE — Assessment & Plan Note (Signed)
Suggested by symptoms but not supported by labs today.  Likely due to radically different lifestyle changes including diet and exercise

## 2017-12-24 NOTE — Assessment & Plan Note (Signed)
With recent episode of esophageal spasm. Resume PPI

## 2018-04-21 ENCOUNTER — Encounter: Payer: Self-pay | Admitting: Family Medicine

## 2018-04-21 ENCOUNTER — Ambulatory Visit: Payer: BLUE CROSS/BLUE SHIELD | Admitting: Family Medicine

## 2018-04-21 VITALS — BP 126/74 | HR 72 | Temp 97.7°F | Wt 210.2 lb

## 2018-04-21 DIAGNOSIS — K5900 Constipation, unspecified: Secondary | ICD-10-CM | POA: Diagnosis not present

## 2018-04-21 DIAGNOSIS — F418 Other specified anxiety disorders: Secondary | ICD-10-CM

## 2018-04-21 DIAGNOSIS — R42 Dizziness and giddiness: Secondary | ICD-10-CM | POA: Diagnosis not present

## 2018-04-21 LAB — CBC WITH DIFFERENTIAL/PLATELET
Basophils Absolute: 0.1 10*3/uL (ref 0.0–0.1)
Basophils Relative: 1 % (ref 0.0–3.0)
Eosinophils Absolute: 0.1 10*3/uL (ref 0.0–0.7)
Eosinophils Relative: 1.1 % (ref 0.0–5.0)
HCT: 41.9 % (ref 36.0–46.0)
Hemoglobin: 14.3 g/dL (ref 12.0–15.0)
Lymphocytes Relative: 38 % (ref 12.0–46.0)
Lymphs Abs: 2.3 10*3/uL (ref 0.7–4.0)
MCHC: 34.1 g/dL (ref 30.0–36.0)
MCV: 90.5 fl (ref 78.0–100.0)
Monocytes Absolute: 0.5 10*3/uL (ref 0.1–1.0)
Monocytes Relative: 8.3 % (ref 3.0–12.0)
Neutro Abs: 3.1 10*3/uL (ref 1.4–7.7)
Neutrophils Relative %: 51.6 % (ref 43.0–77.0)
Platelets: 204 10*3/uL (ref 150.0–400.0)
RBC: 4.63 Mil/uL (ref 3.87–5.11)
RDW: 13.4 % (ref 11.5–15.5)
WBC: 6 10*3/uL (ref 4.0–10.5)

## 2018-04-21 LAB — COMPREHENSIVE METABOLIC PANEL
ALT: 17 U/L (ref 0–35)
AST: 18 U/L (ref 0–37)
Albumin: 4.4 g/dL (ref 3.5–5.2)
Alkaline Phosphatase: 49 U/L (ref 39–117)
BUN: 16 mg/dL (ref 6–23)
CO2: 21 mEq/L (ref 19–32)
Calcium: 9.6 mg/dL (ref 8.4–10.5)
Chloride: 105 mEq/L (ref 96–112)
Creatinine, Ser: 0.74 mg/dL (ref 0.40–1.20)
GFR: 88.57 mL/min (ref >60.00)
Glucose, Bld: 89 mg/dL (ref 70–99)
Potassium: 3.7 mEq/L (ref 3.5–5.1)
Sodium: 137 mEq/L (ref 135–145)
Total Bilirubin: 0.4 mg/dL (ref 0.2–1.2)
Total Protein: 7.4 g/dL (ref 6.0–8.3)

## 2018-04-21 LAB — GLUCOSE, POCT (MANUAL RESULT ENTRY): POC Glucose: 87 mg/dl (ref 70–99)

## 2018-04-21 LAB — TSH: TSH: 2.54 u[IU]/mL (ref 0.35–4.50)

## 2018-04-21 NOTE — Patient Instructions (Signed)
Please go to the lab before you leave.  Please focus on eating three meals a day with scheduled snacks. Document any symptoms that are noticing in regards to when symptoms start, how long they last, and any strategies that are helpful such as eating and types of foods eaten.   Focus on stress reduction activities.  Dizziness Dizziness is a common problem. It makes you feel unsteady or light-headed. You may feel like you are about to pass out (faint). Dizziness can lead to getting hurt if you stumble or fall. Dizziness can be caused by many things, including:  Medicines.  Not having enough water in your body (dehydration).  Illness.  Follow these instructions at home: Eating and drinking  Drink enough fluid to keep your pee (urine) clear or pale yellow. This helps to keep you from getting dehydrated. Try to drink more clear fluids, such as water.  Do not drink alcohol.  Limit how much caffeine you drink or eat, if your doctor tells you to do that.  Limit how much salt (sodium) you drink or eat, if your doctor tells you to do that. Activity  Avoid making quick movements. ? When you stand up from sitting in a chair, steady yourself until you feel okay. ? In the morning, first sit up on the side of the bed. When you feel okay, stand slowly while you hold onto something. Do this until you know that your balance is fine.  If you need to stand in one place for a long time, move your legs often. Tighten and relax the muscles in your legs while you are standing.  Do not drive or use heavy machinery if you feel dizzy.  Avoid bending down if you feel dizzy. Place items in your home so you can reach them easily without leaning over. Lifestyle  Do not use any products that contain nicotine or tobacco, such as cigarettes and e-cigarettes. If you need help quitting, ask your doctor.  Try to lower your stress level. You can do this by using methods such as yoga or meditation. Talk with your  doctor if you need help. General instructions  Watch your dizziness for any changes.  Take over-the-counter and prescription medicines only as told by your doctor. Talk with your doctor if you think that you are dizzy because of a medicine that you are taking.  Tell a friend or a family member that you are feeling dizzy. If he or she notices any changes in your behavior, have this person call your doctor.  Keep all follow-up visits as told by your doctor. This is important. Contact a doctor if:  Your dizziness does not go away.  Your dizziness or light-headedness gets worse.  You feel sick to your stomach (nauseous).  You have trouble hearing.  You have new symptoms.  You are unsteady on your feet.  You feel like the room is spinning. Get help right away if:  You throw up (vomit) or have watery poop (diarrhea), and you cannot eat or drink anything.  You have trouble: ? Talking. ? Walking. ? Swallowing. ? Using your arms, hands, or legs.  You feel generally weak.  You are not thinking clearly, or you have trouble forming sentences. A friend or family member may notice this.  You have: ? Chest pain. ? Pain in your belly (abdomen). ? Shortness of breath. ? Sweating.  Your vision changes.  You are bleeding.  You have a very bad headache.  You have neck pain  or a stiff neck.  You have a fever. These symptoms may be an emergency. Do not wait to see if the symptoms will go away. Get medical help right away. Call your local emergency services (911 in the [place] Do not drive yourself to the hospital. Summary  Dizziness makes you feel unsteady or light-headed. You may feel like you are about to pass out (faint).  Drink enough fluid to keep your pee (urine) clear or pale yellow. Do not drink alcohol.  Avoid making quick movements if you feel dizzy.  Watch your dizziness for any changes. This information is not intended to replace advice given to you by your health  care provider. Make sure you discuss any questions you have with your health care provider. Document Released: 09/26/2011 Document Revised: 10/24/2016 Document Reviewed: 10/24/2016 Elsevier Interactive Patient Education  2017 [employer] and Stress Management Stress is a normal reaction to life events. It is what you feel when life demands more than you are used to or more than you can handle. Some stress can be useful. For example, the stress reaction can help you catch the last bus of the day, study for a test, or meet a deadline at work. But stress that occurs too often or for too long can cause problems. It can affect your emotional health and interfere with relationships and normal daily activities. Too much stress can weaken your immune system and increase your risk for physical illness. If you already have a medical problem, stress can make it worse. What are the causes? All sorts of life events may cause stress. An event that causes stress for one person may not be stressful for another person. Major life events commonly cause stress. These may be positive or negative. Examples include losing your job, moving into a new home, getting married, having a baby, or losing a loved one. Less obvious life events may also cause stress, especially if they occur day after day or in combination. Examples include working long hours, driving in traffic, caring for children, being in debt, or being in a difficult relationship. What are the signs or symptoms? Stress may cause emotional symptoms including, the following:  Anxiety. This is feeling worried, afraid, on edge, overwhelmed, or out of control.  Anger. This is feeling irritated or impatient.  Depression. This is feeling sad, down, helpless, or guilty.  Difficulty focusing, remembering, or making decisions.  Stress may cause physical symptoms, including the following:  Aches and pains. These may affect your head, neck, back,  stomach, or other areas of your body.  Tight muscles or clenched jaw.  Low energy or trouble sleeping.  Stress may cause unhealthy behaviors, including the following:  Eating to feel better (overeating) or skipping meals.  Sleeping too little, too much, or both.  Working too much or putting off tasks (procrastination).  Smoking, drinking alcohol, or using drugs to feel better.  How is this diagnosed? Stress is diagnosed through an assessment by your health care provider. Your health care provider will ask questions about your symptoms and any stressful life events.Your health care provider will also ask about your medical history and may order blood tests or other tests. Certain medical conditions and medicine can cause physical symptoms similar to stress. Mental illness can cause emotional symptoms and unhealthy behaviors similar to stress. Your health care provider may refer you to a mental health professional for further evaluation. How is this treated? Stress management is the recommended treatment for  stress.The goals of stress management are reducing stressful life events and coping with stress in healthy ways. Techniques for reducing stressful life events include the following:  Stress identification. Self-monitor for stress and identify what causes stress for you. These skills may help you to avoid some stressful events.  Time management. Set your priorities, keep a calendar of events, and learn to say "no." These tools can help you avoid making too many commitments.  Techniques for coping with stress include the following:  Rethinking the problem. Try to think realistically about stressful events rather than ignoring them or overreacting. Try to find the positives in a stressful situation rather than focusing on the negatives.  Exercise. Physical exercise can release both physical and emotional tension. The key is to find a form of exercise you enjoy and do it  regularly.  Relaxation techniques. These relax the body and mind. Examples include yoga, meditation, tai chi, biofeedback, deep breathing, progressive muscle relaxation, listening to music, being out in nature, journaling, and other hobbies. Again, the key is to find one or more that you enjoy and can do regularly.  Healthy lifestyle. Eat a balanced diet, get plenty of sleep, and do not smoke. Avoid using alcohol or drugs to relax.  Strong support network. Spend time with family, friends, or other people you enjoy being around.Express your feelings and talk things over with someone you trust.  Counseling or talktherapy with a mental health professional may be helpful if you are having difficulty managing stress on your own. Medicine is typically not recommended for the treatment of stress.Talk to your health care provider if you think you need medicine for symptoms of stress. Follow these instructions at home:  Keep all follow-up visits as directed by your health care provider.  Take all medicines as directed by your health care provider. Contact a health care provider if:  Your symptoms get worse or you start having new symptoms.  You feel overwhelmed by your problems and can no longer manage them on your own. Get help right away if:  You feel like hurting yourself or someone else. This information is not intended to replace advice given to you by your health care provider. Make sure you discuss any questions you have with your health care provider. Document Released: 04/02/2001 Document Revised: 03/14/2016 Document Reviewed: 06/01/2013 Elsevier Interactive Patient Education  2017 [employer].

## 2018-04-21 NOTE — Progress Notes (Signed)
Subjective:    Patient ID: Kristin Davis, female    DOB: 1971/08/24, 47 y.o.   MRN: [id number]  HPI  Kristin Davis is a 47 year old female who presents today with multiple medical concerns. She reports that she is concerned about her blood sugar. She reports that she has experienced "brain fog" with feeling one episode of a mild headache that resolved after treatment with one dose of acetaminophen.  Associated intermittent tingling in her lips, intermittent episodes of feeling "off balance or dizzy"  She reports that symptoms started after recent loss of family dog and stress at job. Additional stress of son who will be deployed and planning wedding for him over a 3 week period due to deployment. She reports associated chills that can occur intermittently and has felt "under the weather." She has noticed constipation with BMs occurring every 2 to 3 days. No treatments have been tried at home. She denies thin caliber stool or blood in stool. She has started a new diet at the end of February 2019 with no refined sugars, no carbohydrates, and she has been exercising but this has been decreased due to recent life stressors noted above.   She reports eating 3 meals a day and may eat either almonds or pork rinds for a snack but not every day. She denies any symptoms that are related to periods of fasting.  No history of diabetes present.  History of obesity, hyper/hypothyroidism, and GAD  She is not a smoker and does not drink alcohol  Dizziness reported intermittently: Presyncope/syncope associated: No Evaluation of provoking factors that can cause symptoms below: Change in head position: No Ear pressure: No Coughing/sneezing: No Valsalva: No Occur with standing/getting out of bed: No Lying down/rolling over in bed/bending neck: No Hearing loss: No  Denies N/V, HA, change in vision, numbness, weakness, abnormal coordination/gait History of cardiac dysfunction: No per patient  Review of  Systems  Constitutional: Positive for chills. Negative for diaphoresis, fatigue and fever.  Eyes: Negative for visual disturbance.  Respiratory: Negative for cough, shortness of breath and wheezing.   Cardiovascular: Negative for chest pain and palpitations.  Gastrointestinal: Positive for constipation. Negative for abdominal pain, diarrhea, nausea and vomiting.  Endocrine: Negative for polydipsia, polyphagia and polyuria.  Genitourinary: Negative for dysuria and hematuria.  Skin: Negative for rash.  Neurological: Negative for dizziness, weakness and light-headedness.  Psychiatric/Behavioral:       Denies depressed mood. Increased stress has been present that has caused anxiety.   No past medical history on file.   Social History   Socioeconomic History  . Marital status: Married    Spouse name: Not on file  . Number of children: Not on file  . Years of education: Not on file  . Highest education level: Not on file  Occupational History  . Not on file  Social Needs  . Financial resource strain: Not on file  . Food insecurity:    Worry: Not on file    Inability: Not on file  . Transportation needs:    Medical: Not on file    Non-medical: Not on file  Tobacco Use  . Smoking status: Never Smoker  . Smokeless tobacco: Never Used  Substance and Sexual Activity  . Alcohol use: No  . Drug use: No  . Sexual activity: Not on file  Lifestyle  . Physical activity:    Days per week: Not on file    Minutes per session: Not on file  . Stress:  Not on file  Relationships  . Social connections:    Talks on phone: Not on file    Gets together: Not on file    Attends religious service: Not on file    Active member of club or organization: Not on file    Attends meetings of clubs or organizations: Not on file    Relationship status: Not on file  . Intimate partner violence:    Fear of current or ex partner: Not on file    Emotionally abused: Not on file    Physically abused: Not  on file    Forced sexual activity: Not on file  Other Topics Concern  . Not on file  Social History Narrative  . Not on file    Past Surgical History:  Procedure Laterality Date  . ABDOMINAL HYSTERECTOMY  2015  . BREAST BIOPSY Right 09/30/2013   benign    Family History  Problem Relation Age of Onset  . Heart attack Father   . Alcohol abuse Father   . Aortic aneurysm Father   . Cancer Maternal Grandmother        ovarian  . Thyroid disease Mother     No Known Allergies  No current outpatient medications on file prior to visit.   No current facility-administered medications on file prior to visit.     BP 126/74 (BP Location: Left Arm, Patient Position: Sitting, Cuff Size: Large)   Pulse 72   Temp 97.7 F (36.5 C) (Oral)   Wt 210 lb 4 oz (95.4 kg)   LMP 10/28/2012   SpO2 98%   BMI 32.93 kg/m       Objective:   Physical Exam  Constitutional: She is oriented to person, place, and time. She appears well-developed and well-nourished.  HENT:  Mouth/Throat: No oropharyngeal exudate.  Eyes: Pupils are equal, round, and reactive to light. No scleral icterus.  Neck: Neck supple.  Cardiovascular: Normal rate, regular rhythm, normal heart sounds and intact distal pulses.  Pulmonary/Chest: Effort normal and breath sounds normal. She has no wheezes. She has no rales.  Abdominal: Soft. Bowel sounds are normal. There is no tenderness.  Musculoskeletal: She exhibits no edema.  Lymphadenopathy:    She has no cervical adenopathy.  Neurological: She is alert and oriented to person, place, and time.  II-Visual fields grossly intact. III/IV/VI-Extraocular movements intact. Pupils reactive bilaterally. V/VII-Smile symmetric, equal eyebrow raise, facial sensation intact VIII- Hearing grossly intact XI-bilateral shoulder shrug XII-midline tongue extension Motor: 5/5 bilaterally with normal tone and bulk Cerebellar: Normal finger-to-nose Romberg negative Ambulates with a  coordinated gait  Skin: Skin is warm and dry. Capillary refill takes less than 2 seconds. No rash noted.  Psychiatric: She has a normal mood and affect. Her behavior is normal. Judgment and thought content normal.  Anxiety about health and recent life stress       Assessment & Plan:  1. Dizziness and giddiness Improved today; POC CBG: 87; no symptoms of hypoglycemia present. Neuro exam is reassuring today. We discussed the importance of remaining hydrated and monitoring symptoms to identify any known triggers such as fasting, or moving from a sitting to standing position.  She discussed concern for low blood sugar so POC CBG obtained and we further discussed that checking CBG in absence of symptoms will most likely not reveal a low number. After further discussion, she is interested in obtaining lab work today and will monitor for symptoms and let us know if symptoms continue, worsen, or new symptoms  develop. Close return precautions provided today.   - CMP - CBC with Differential/Platelet - TSH  2. Anxiety about health Patient endorses anxiety with work, home life, and concern for her health. Recent change in dietary patterns with anxiety may be contributing to overall symptoms today. Patient requested recheck of thyroid today and this was ordered. She declined further work up for anxiety today. Provided information regarding stress management and advised close follow up if symptoms do not improve.  3. Constipation, unspecified constipation type Reviewed dietary management with water and fiber with vegetables and fruit today. Further discussed use of Miralax if needed which can promote improved BMs.  Close return precautions provided.  Delano Metz, FNP-C

## 2018-08-06 DIAGNOSIS — Z23 Encounter for immunization: Secondary | ICD-10-CM | POA: Diagnosis not present

## 2018-08-17 ENCOUNTER — Other Ambulatory Visit
Admission: RE | Admit: 2018-08-17 | Discharge: 2018-08-17 | Disposition: A | Discharge status: Home / Self Care | Payer: BLUE CROSS/BLUE SHIELD | Source: Ambulatory Visit | Attending: Family Medicine | Admitting: Family Medicine

## 2018-08-17 ENCOUNTER — Encounter: Payer: Self-pay | Admitting: Family Medicine

## 2018-08-17 ENCOUNTER — Ambulatory Visit: Payer: BLUE CROSS/BLUE SHIELD | Admitting: Family Medicine

## 2018-08-17 VITALS — BP 138/78 | HR 73 | Temp 98.4°F | Ht 66.0 in | Wt 209.0 lb

## 2018-08-17 DIAGNOSIS — R110 Nausea: Secondary | ICD-10-CM | POA: Insufficient documentation

## 2018-08-17 DIAGNOSIS — R5383 Other fatigue: Secondary | ICD-10-CM

## 2018-08-17 DIAGNOSIS — B719 Cestode infection, unspecified: Secondary | ICD-10-CM

## 2018-08-17 LAB — COMPREHENSIVE METABOLIC PANEL
ALT: 16 U/L (ref 0–35)
AST: 15 U/L (ref 0–37)
Albumin: 4.3 g/dL (ref 3.5–5.2)
Alkaline Phosphatase: 55 U/L (ref 39–117)
BUN: 16 mg/dL (ref 6–23)
CO2: 26 mEq/L (ref 19–32)
Calcium: 9.3 mg/dL (ref 8.4–10.5)
Chloride: 104 mEq/L (ref 96–112)
Creatinine, Ser: 0.78 mg/dL (ref 0.40–1.20)
GFR: 83.24 mL/min (ref >60.00)
Glucose, Bld: 121 mg/dL — ABNORMAL HIGH (ref 70–99)
Potassium: 3.4 mEq/L — ABNORMAL LOW (ref 3.5–5.1)
Sodium: 138 mEq/L (ref 135–145)
Total Bilirubin: 0.3 mg/dL (ref 0.2–1.2)
Total Protein: 7.1 g/dL (ref 6.0–8.3)

## 2018-08-17 LAB — CBC WITH DIFFERENTIAL/PLATELET
Basophils Absolute: 0 10*3/uL (ref 0.0–0.1)
Basophils Relative: 0.7 % (ref 0.0–3.0)
Eosinophils Absolute: 0.1 10*3/uL (ref 0.0–0.7)
Eosinophils Relative: 1.3 % (ref 0.0–5.0)
HCT: 41.2 % (ref 36.0–46.0)
Hemoglobin: 13.9 g/dL (ref 12.0–15.0)
Lymphocytes Relative: 48.4 % — ABNORMAL HIGH (ref 12.0–46.0)
Lymphs Abs: 2.7 10*3/uL (ref 0.7–4.0)
MCHC: 33.7 g/dL (ref 30.0–36.0)
MCV: 91.9 fl (ref 78.0–100.0)
Monocytes Absolute: 0.4 10*3/uL (ref 0.1–1.0)
Monocytes Relative: 7.8 % (ref 3.0–12.0)
Neutro Abs: 2.4 10*3/uL (ref 1.4–7.7)
Neutrophils Relative %: 41.8 % — ABNORMAL LOW (ref 43.0–77.0)
Platelets: 202 10*3/uL (ref 150.0–400.0)
RBC: 4.49 Mil/uL (ref 3.87–5.11)
RDW: 12.6 % (ref 11.5–15.5)
WBC: 5.6 10*3/uL (ref 4.0–10.5)

## 2018-08-17 LAB — TSH: TSH: 2.17 u[IU]/mL (ref 0.35–4.50)

## 2018-08-17 MED ORDER — PRAZIQUANTEL 600 MG PO TABS: ORAL_TABLET | ORAL | 0 refills | Status: DC

## 2018-08-17 NOTE — Progress Notes (Signed)
Subjective:    Patient ID: Kristin Davis, female    DOB: [birth year], 47 y.o.   MRN: [id number]  HPI   Patient presents to clinic due to tapeworm.  Patient states that maybe about a month or 2 ago she started having some mild fatigue and nausea and some mild indigestion, did not really think too much of it.  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  States her veterinarian said he is able to do human testing for parasites, so she gave him a stool sample.  Patient brought in the form from her that showing that her stool sample was positive for tapeworm including eggs and larva.  Patient denies any fever or chills.  Denies any nausea or vomiting.  Denies any unexplained weight loss.  Patient Active Problem List   Diagnosis Date Noted  . Hypothyroidism 11/18/2016  . Inguinal pain 10/28/2016  . Other fatigue 06/11/2016  . Muscle pain 06/11/2016  . Generalized anxiety disorder 06/11/2016  . UTI (urinary tract infection) 02/23/2016  . Other specified symptoms and signs involving the digestive system and abdomen 02/16/2016  . Gastritis and gastroduodenitis 08/10/2015  . Esophagitis, unspecified 08/10/2015  . Excessive or frequent menstruation 08/10/2015  . Hyperthyroidism 05/31/2014  . Midline thoracic back pain 04/14/2014  . Primary snoring 04/14/2014  . History of abnormal mammogram 10/03/2013  . Obesity (BMI 30-39.9) 11/13/2012   Social History   Tobacco Use  . Smoking status: Never Smoker  . Smokeless tobacco: Never Used  Substance Use Topics  . Alcohol use: No   Review of Systems  Constitutional: +fatigue. Negative for chills, and fever.  HENT: Negative for congestion, ear pain, sinus pain and sore throat.   Eyes: Negative.   Respiratory: Negative for cough, shortness of breath and wheezing.   Cardiovascular: Negative for chest pain, palpitations and leg swelling.  Gastrointestinal: +nausea, indigestion. Negative for abdominal pain, diarrhea, and  vomiting.  Genitourinary: Negative for dysuria, frequency and urgency.  Musculoskeletal: Negative for arthralgias and myalgias.  Skin: Negative for color change, pallor and rash.  Neurological: Negative for syncope, light-headedness and headaches.  Psychiatric/Behavioral: The patient is not nervous/anxious.    Objective:   Physical Exam  Constitutional: She appears well-developed and well-nourished. No distress.  Head: Normocephalic and atraumatic.  Eyes: EOM are normal. No scleral icterus.  Neck: Normal range of motion. Neck supple. No tracheal deviation present.  Cardiovascular: Normal rate, regular rhythm and normal heart sounds.  Pulmonary/Chest: Effort normal and breath sounds normal. No respiratory distress. She has no wheezes. She has no rales.  Abdominal: Soft. Bowel sounds are normal. There is no tenderness.  Neurological: She is alert and oriented to person, place, and time.  Gait normal  Skin: Skin is warm and dry. No pallor.  Psychiatric: She has a normal mood and affect. Her behavior is normal. Thought content normal.  Nursing note and vitals reviewed.    Vitals:   08/17/18 1116  BP: 138/78  Pulse: 73  Temp: 98.4 F (36.9 C)  SpO2: 99%   Assessment & Plan:   Tape worm - on-call infectious disease MD was called to discuss case.  Dr. Megan Salon suggested due to unclear testing from veterinarian, that we should repeat stool sampling to be sure we are treating the correct thing.  Order for ova and parasite stool sample testing placed.  Fatigue/nausea - lab work including CBC, CMP and thyroid panel ordered to further evaluate fatigue and nausea symptoms.  The symptoms are vague  and could be related to many different things, so this lab work will help [place] further investigate.  Patient offered Zofran as needed antinausea medication, but he declined stating the nausea is not really that bad  Patient is aware that we will contact her with all of her results and treat  accordingly.  Advised that if anything changes or symptoms worsen to call office right away.

## 2018-08-18 ENCOUNTER — Encounter: Payer: Self-pay | Admitting: Family Medicine

## 2018-08-21 ENCOUNTER — Encounter: Payer: Self-pay | Admitting: Family Medicine

## 2018-08-21 ENCOUNTER — Other Ambulatory Visit: Payer: Self-pay | Admitting: Family Medicine

## 2018-08-21 DIAGNOSIS — B719 Cestode infection, unspecified: Secondary | ICD-10-CM

## 2018-08-21 LAB — OVA + PARASITE EXAM

## 2018-08-21 LAB — O&P RESULT

## 2018-08-21 MED ORDER — PRAZIQUANTEL 600 MG PO TABS: ORAL_TABLET | ORAL | 0 refills | Status: DC

## 2018-08-24 ENCOUNTER — Other Ambulatory Visit: Payer: Self-pay | Admitting: Family Medicine

## 2018-08-24 DIAGNOSIS — B719 Cestode infection, unspecified: Secondary | ICD-10-CM

## 2018-08-24 MED ORDER — PRAZIQUANTEL 600 MG PO TABS: ORAL_TABLET | ORAL | 0 refills | Status: DC

## 2018-09-08 DIAGNOSIS — H903 Sensorineural hearing loss, bilateral: Secondary | ICD-10-CM | POA: Diagnosis not present

## 2018-09-08 DIAGNOSIS — J358 Other chronic diseases of tonsils and adenoids: Secondary | ICD-10-CM | POA: Diagnosis not present

## 2018-09-08 DIAGNOSIS — H6980 Other specified disorders of Eustachian tube, unspecified ear: Secondary | ICD-10-CM | POA: Diagnosis not present

## 2018-09-08 DIAGNOSIS — H9319 Tinnitus, unspecified ear: Secondary | ICD-10-CM | POA: Diagnosis not present

## 2018-09-14 ENCOUNTER — Other Ambulatory Visit: Payer: Self-pay | Admitting: Internal Medicine

## 2018-09-14 DIAGNOSIS — Z1231 Encounter for screening mammogram for malignant neoplasm of breast: Secondary | ICD-10-CM

## 2018-11-03 ENCOUNTER — Ambulatory Visit
Admission: RE | Admit: 2018-11-03 | Discharge: 2018-11-03 | Disposition: A | Discharge status: Home / Self Care | Payer: BLUE CROSS/BLUE SHIELD | Source: Ambulatory Visit | Attending: Internal Medicine | Admitting: Internal Medicine

## 2018-11-03 DIAGNOSIS — Z1231 Encounter for screening mammogram for malignant neoplasm of breast: Secondary | ICD-10-CM

## 2019-03-31 DIAGNOSIS — Z1211 Encounter for screening for malignant neoplasm of colon: Secondary | ICD-10-CM | POA: Diagnosis not present

## 2019-03-31 DIAGNOSIS — R1013 Epigastric pain: Secondary | ICD-10-CM | POA: Diagnosis not present

## 2019-04-27 ENCOUNTER — Other Ambulatory Visit
Admission: RE | Admit: 2019-04-27 | Discharge: 2019-04-27 | Disposition: A | Discharge status: Home / Self Care | Payer: BC Managed Care – PPO | Source: Ambulatory Visit | Attending: Gastroenterology | Admitting: Gastroenterology

## 2019-04-30 ENCOUNTER — Ambulatory Visit: Admit: 2019-04-30 | Payer: BC Managed Care – PPO | Admitting: Gastroenterology

## 2019-04-30 SURGERY — COLONOSCOPY WITH PROPOFOL
Anesthesia: General

## 2019-05-27 ENCOUNTER — Ambulatory Visit (INDEPENDENT_AMBULATORY_CARE_PROVIDER_SITE_OTHER): Payer: BC Managed Care – PPO | Admitting: Internal Medicine

## 2019-05-27 ENCOUNTER — Other Ambulatory Visit: Payer: Self-pay

## 2019-05-27 DIAGNOSIS — E559 Vitamin D deficiency, unspecified: Secondary | ICD-10-CM

## 2019-05-27 DIAGNOSIS — Z13220 Encounter for screening for lipoid disorders: Secondary | ICD-10-CM | POA: Diagnosis not present

## 2019-05-27 DIAGNOSIS — R5383 Other fatigue: Secondary | ICD-10-CM | POA: Diagnosis not present

## 2019-05-27 DIAGNOSIS — R739 Hyperglycemia, unspecified: Secondary | ICD-10-CM

## 2019-05-27 DIAGNOSIS — E538 Deficiency of other specified B group vitamins: Secondary | ICD-10-CM

## 2019-05-27 DIAGNOSIS — R42 Dizziness and giddiness: Secondary | ICD-10-CM

## 2019-05-27 DIAGNOSIS — R1013 Epigastric pain: Secondary | ICD-10-CM

## 2019-05-27 NOTE — Progress Notes (Signed)
telephone Note  I connected with Kristin Davis   on 05/27/19 at  1:00 PM EDT by a telephone and verified that I am speaking with the correct person using two identifiers.  Location patient: home Location provider:work or home office Persons participating in the virtual visit: patient, provider  I discussed the limitations of evaluation and management by telemedicine and the availability of in person appointments. The patient expressed understanding and agreed to proceed.   HPI: 1. She c/o fatigue since last visit with PCP, dizziness but the room is not spinning and epigastric dull ab pain w/o vomiting and sensation like abdomen is "spasming". She thought it was her blood sugar and bough a glucose meter cbgs in the am 80s and after food 126 and at lunch 80s. She reports doing a whole food diet with grains, no added sugar, limited dairy, fruits/veggies and meat x 2.5 weeks. She is also taking 3 pills of amino acids daily in the am  2. Epigastric dull ab pain w/o n/v new since last visit with PCP and more frequent w/in the last month intermittent. 3. Dizziness w/o vertigo sx's she feels like she is spinning not the room, she is drinking enough water, denies sinus issues or h/a      ROS: See pertinent positives and negatives per HPI.  No past medical history on file.  Past Surgical History:  Procedure Laterality Date  . ABDOMINAL HYSTERECTOMY  2015  . BREAST BIOPSY Right 09/30/2013   benign    Family History  Problem Relation Age of Onset  . Heart attack Father   . Alcohol abuse Father   . Aortic aneurysm Father   . Cancer Maternal Grandmother        ovarian  . Thyroid disease Mother     SOCIAL HX: lives at home   Current Outpatient Medications:  .  omeprazole (PRILOSEC) 20 MG capsule, Take by mouth daily., Disp: , Rfl:  .  praziquantel (BILTRICIDE) 600 MG tablet, Take 2 tablets by mouth as a single dose. Take 10 days after the initial dose of this medication., Disp: 2  tablet, Rfl: 0  EXAM:  VITALS per patient if applicable:  GENERAL: alert, oriented, appears well and in no acute distress  PSYCH/NEURO: pleasant and cooperative, no obvious depression or anxiety, speech and thought processing grossly intact  ASSESSMENT AND PLAN:  Discussed the following assessment and plan:  Dizziness? Etiology nonspecific reviewed with pt cbg not low <70 - Plan: hydration  Labs [facility] 05/28/19  F/u with PCP in 1 week consider orthostatics imaging I.e MRI/CT if continues   Fatigue, unspecified type - Plan: Comprehensive metabolic panel, CBC w/Diff, Urinalysis, Routine w reflex microscopic, TSH, B12, vitamin D  Hyperglycemia - Plan: Hemoglobin A1c  Epigastric abdominal pain r/o pancreatitis vs other - Plan: Lipase, US Abdomen Complete -if continues consider H pylori testing or GI for EGD  F/u with PCP in 1 week     I discussed the assessment and treatment plan with the patient. The patient was provided an opportunity to ask questions and all were answered. The patient agreed with the plan and demonstrated an understanding of the instructions.   The patient was advised to call back or seek an in-person evaluation if the symptoms worsen or if the condition fails to improve as anticipated.  Time spent 15 minute  Delorise Jackson, MD

## 2019-05-27 NOTE — Patient Instructions (Signed)
Dizziness Dizziness is a common problem. It is a feeling of unsteadiness or light-headedness. You may feel like you are about to faint. Dizziness can lead to injury if you stumble or fall. Anyone can become dizzy, but dizziness is more common in older adults. This condition can be caused by a number of things, including medicines, dehydration, or illness. Follow these instructions at home: Eating and drinking  Drink enough fluid to keep your urine clear or pale yellow. This helps to keep you from becoming dehydrated. Try to drink more clear fluids, such as water.  Do not drink alcohol.  Limit your caffeine intake if told to do so by your health care provider. Check ingredients and nutrition facts to see if a food or beverage contains caffeine.  Limit your salt (sodium) intake if told to do so by your health care provider. Check ingredients and nutrition facts to see if a food or beverage contains sodium. Activity  Avoid making quick movements. ? Rise slowly from chairs and steady yourself until you feel okay. ? In the morning, first sit up on the side of the bed. When you feel okay, stand slowly while you hold onto something until you know that your balance is fine.  If you need to stand in one place for a long time, move your legs often. Tighten and relax the muscles in your legs while you are standing.  Do not drive or use heavy machinery if you feel dizzy.  Avoid bending down if you feel dizzy. Place items in your home so that they are easy for you to reach without leaning over. Lifestyle  Do not use any products that contain nicotine or tobacco, such as cigarettes and e-cigarettes. If you need help quitting, ask your health care provider.  Try to reduce your stress level by using methods such as yoga or meditation. Talk with your health care provider if you need help to manage your stress. General instructions  Watch your dizziness for any changes.  Take over-the-counter and  prescription medicines only as told by your health care provider. Talk with your health care provider if you think that your dizziness is caused by a medicine that you are taking.  Tell a friend or a family member that you are feeling dizzy. If he or she notices any changes in your behavior, have this person call your health care provider.  Keep all follow-up visits as told by your health care provider. This is important. Contact a health care provider if:  Your dizziness does not go away.  Your dizziness or light-headedness gets worse.  You feel nauseous.  You have reduced hearing.  You have new symptoms.  You are unsteady on your feet or you feel like the room is spinning. Get help right away if:  You vomit or have diarrhea and are unable to eat or drink anything.  You have problems talking, walking, swallowing, or using your arms, hands, or legs.  You feel generally weak.  You are not thinking clearly or you have trouble forming sentences. It may take a friend or family member to notice this.  You have chest pain, abdominal pain, shortness of breath, or sweating.  Your vision changes.  You have any bleeding.  You have a severe headache.  You have neck pain or a stiff neck.  You have a fever. These symptoms may represent a serious problem that is an emergency. Do not wait to see if the symptoms will go away. Get medical help   right away. Call your local emergency services (911 in the [place] Do not drive yourself to the hospital. Summary  Dizziness is a feeling of unsteadiness or light-headedness. This condition can be caused by a number of things, including medicines, dehydration, or illness.  Anyone can become dizzy, but dizziness is more common in older adults.  Drink enough fluid to keep your urine clear or pale yellow. Do not drink alcohol.  Avoid making quick movements if you feel dizzy. Monitor your dizziness for any changes. This information is not intended to  replace advice given to you by your health care provider. Make sure you discuss any questions you have with your health care provider. Document Released: 04/02/2001 Document Revised: 10/10/2017 Document Reviewed: 11/09/2016 Elsevier Patient Education  2020 [employer].

## 2019-05-27 NOTE — Progress Notes (Signed)
Scheduled in the morning 8/7 @ 8 since she ate around 1230 today. They will call your cell # with the results.

## 2019-05-28 ENCOUNTER — Other Ambulatory Visit: Payer: Self-pay | Admitting: Internal Medicine

## 2019-05-28 ENCOUNTER — Other Ambulatory Visit: Payer: Self-pay

## 2019-05-28 ENCOUNTER — Ambulatory Visit
Admission: RE | Admit: 2019-05-28 | Discharge: 2019-05-28 | Disposition: A | Discharge status: Home / Self Care | Payer: BC Managed Care – PPO | Source: Ambulatory Visit | Attending: Internal Medicine | Admitting: Internal Medicine

## 2019-05-28 DIAGNOSIS — R5383 Other fatigue: Secondary | ICD-10-CM | POA: Diagnosis not present

## 2019-05-28 DIAGNOSIS — Z13220 Encounter for screening for lipoid disorders: Secondary | ICD-10-CM | POA: Diagnosis not present

## 2019-05-28 DIAGNOSIS — E559 Vitamin D deficiency, unspecified: Secondary | ICD-10-CM | POA: Diagnosis not present

## 2019-05-28 DIAGNOSIS — E538 Deficiency of other specified B group vitamins: Secondary | ICD-10-CM | POA: Diagnosis not present

## 2019-05-28 DIAGNOSIS — R739 Hyperglycemia, unspecified: Secondary | ICD-10-CM | POA: Diagnosis not present

## 2019-05-28 DIAGNOSIS — R1013 Epigastric pain: Secondary | ICD-10-CM

## 2019-05-28 DIAGNOSIS — R1011 Right upper quadrant pain: Secondary | ICD-10-CM

## 2019-05-29 LAB — LIPID PANEL
Chol/HDL Ratio: 4.8 ratio — ABNORMAL HIGH (ref 0.0–4.4)
Cholesterol, Total: 211 mg/dL — ABNORMAL HIGH (ref 100–199)
HDL: 44 mg/dL (ref >39)
LDL Calculated: 148 mg/dL — ABNORMAL HIGH (ref 0–99)
Triglycerides: 95 mg/dL (ref 0–149)
VLDL Cholesterol Cal: 19 mg/dL (ref 5–40)

## 2019-05-29 LAB — CBC WITH DIFFERENTIAL/PLATELET
Basophils Absolute: 0.1 10*3/uL (ref 0.0–0.2)
Basos: 1 %
EOS (ABSOLUTE): 0.1 10*3/uL (ref 0.0–0.4)
Eos: 1 %
Hematocrit: 42.9 % (ref 34.0–46.6)
Hemoglobin: 14.5 g/dL (ref 11.1–15.9)
Immature Grans (Abs): 0 10*3/uL (ref 0.0–0.1)
Immature Granulocytes: 0 %
Lymphocytes Absolute: 2.3 10*3/uL (ref 0.7–3.1)
Lymphs: 42 %
MCH: 30.4 pg (ref 26.6–33.0)
MCHC: 33.8 g/dL (ref 31.5–35.7)
MCV: 90 fL (ref 79–97)
Monocytes Absolute: 0.5 10*3/uL (ref 0.1–0.9)
Monocytes: 9 %
Neutrophils Absolute: 2.5 10*3/uL (ref 1.4–7.0)
Neutrophils: 47 %
Platelets: 206 10*3/uL (ref 150–450)
RBC: 4.77 x10E6/uL (ref 3.77–5.28)
RDW: 12.3 % (ref 11.7–15.4)
WBC: 5.4 10*3/uL (ref 3.4–10.8)

## 2019-05-29 LAB — URINALYSIS, ROUTINE W REFLEX MICROSCOPIC
Bilirubin, UA: NEGATIVE
Glucose, UA: NEGATIVE
Ketones, UA: NEGATIVE
Nitrite, UA: NEGATIVE
Protein,UA: NEGATIVE
RBC, UA: NEGATIVE
Specific Gravity, UA: 1.022 (ref 1.005–1.030)
Urobilinogen, Ur: 0.2 mg/dL (ref 0.2–1.0)
pH, UA: 7.5 (ref 5.0–7.5)

## 2019-05-29 LAB — COMPREHENSIVE METABOLIC PANEL
ALT: 22 IU/L (ref 0–32)
AST: 18 IU/L (ref 0–40)
Albumin/Globulin Ratio: 2 (ref 1.2–2.2)
Albumin: 4.6 g/dL (ref 3.8–4.8)
Alkaline Phosphatase: 52 IU/L (ref 39–117)
BUN/Creatinine Ratio: 18 (ref 9–23)
BUN: 16 mg/dL (ref 6–24)
Bilirubin Total: 0.4 mg/dL (ref 0.0–1.2)
CO2: 20 mmol/L (ref 20–29)
Calcium: 9.4 mg/dL (ref 8.7–10.2)
Chloride: 103 mmol/L (ref 96–106)
Creatinine, Ser: 0.89 mg/dL (ref 0.57–1.00)
GFR calc Af Amer: 87 mL/min/{1.73_m2} (ref >59)
GFR calc non Af Amer: 76 mL/min/{1.73_m2} (ref >59)
Globulin, Total: 2.3 g/dL (ref 1.5–4.5)
Glucose: 94 mg/dL (ref 65–99)
Potassium: 4.2 mmol/L (ref 3.5–5.2)
Sodium: 140 mmol/L (ref 134–144)
Total Protein: 6.9 g/dL (ref 6.0–8.5)

## 2019-05-29 LAB — MICROSCOPIC EXAMINATION: Casts: NONE SEEN /lpf

## 2019-05-29 LAB — HEMOGLOBIN A1C
Est. average glucose Bld gHb Est-mCnc: 105 mg/dL
Hgb A1c MFr Bld: 5.3 % (ref 4.8–5.6)

## 2019-05-29 LAB — VITAMIN D 25 HYDROXY (VIT D DEFICIENCY, FRACTURES): Vit D, 25-Hydroxy: 27.2 ng/mL — ABNORMAL LOW (ref 30.0–100.0)

## 2019-05-29 LAB — LIPASE: Lipase: 42 U/L (ref 14–72)

## 2019-05-29 LAB — VITAMIN B12: Vitamin B-12: 435 pg/mL (ref 232–1245)

## 2019-05-29 LAB — TSH: TSH: 3.44 u[IU]/mL (ref 0.450–4.500)

## 2019-06-04 ENCOUNTER — Ambulatory Visit
Admission: RE | Admit: 2019-06-04 | Discharge: 2019-06-04 | Disposition: A | Discharge status: Home / Self Care | Payer: BC Managed Care – PPO | Source: Ambulatory Visit | Attending: Internal Medicine | Admitting: Internal Medicine

## 2019-06-04 ENCOUNTER — Other Ambulatory Visit: Payer: Self-pay

## 2019-06-04 DIAGNOSIS — K760 Fatty (change of) liver, not elsewhere classified: Secondary | ICD-10-CM | POA: Diagnosis not present

## 2019-06-04 DIAGNOSIS — R1011 Right upper quadrant pain: Secondary | ICD-10-CM | POA: Diagnosis not present

## 2019-06-04 MED ORDER — IOHEXOL 300 MG/ML  SOLN
100.0000 mL | Freq: Once | INTRAMUSCULAR | Status: AC | PRN
  Administered 2019-06-04: 100 mL via INTRAVENOUS

## 2019-06-09 ENCOUNTER — Other Ambulatory Visit: Payer: Self-pay

## 2019-06-09 ENCOUNTER — Ambulatory Visit (INDEPENDENT_AMBULATORY_CARE_PROVIDER_SITE_OTHER): Payer: BC Managed Care – PPO | Admitting: Internal Medicine

## 2019-06-09 ENCOUNTER — Encounter: Payer: Self-pay | Admitting: Internal Medicine

## 2019-06-09 DIAGNOSIS — Z79899 Other long term (current) drug therapy: Secondary | ICD-10-CM | POA: Diagnosis not present

## 2019-06-09 DIAGNOSIS — M546 Pain in thoracic spine: Secondary | ICD-10-CM

## 2019-06-09 DIAGNOSIS — K760 Fatty (change of) liver, not elsewhere classified: Secondary | ICD-10-CM | POA: Insufficient documentation

## 2019-06-09 DIAGNOSIS — R7301 Impaired fasting glucose: Secondary | ICD-10-CM

## 2019-06-09 DIAGNOSIS — E039 Hypothyroidism, unspecified: Secondary | ICD-10-CM

## 2019-06-09 DIAGNOSIS — E669 Obesity, unspecified: Secondary | ICD-10-CM

## 2019-06-09 DIAGNOSIS — Z5189 Encounter for other specified aftercare: Secondary | ICD-10-CM | POA: Insufficient documentation

## 2019-06-09 DIAGNOSIS — Z90710 Acquired absence of both cervix and uterus: Secondary | ICD-10-CM | POA: Insufficient documentation

## 2019-06-09 NOTE — Patient Instructions (Addendum)
Return for fasting labs in [redacted] weeks along with an RN visit for vital signs and shingrx vaccine   Fatty Liver Disease  Fatty liver disease occurs when too much fat has built up in your liver cells. Fatty liver disease is also called hepatic steatosis or steatohepatitis. The liver removes harmful substances from your bloodstream and produces fluids that your body needs. It also helps your body use and store energy from the food you eat. In many cases, fatty liver disease does not cause symptoms or problems. It is often diagnosed when tests are being done for other reasons. However, over time, fatty liver can cause inflammation that may lead to more serious liver problems, such as scarring of the liver (cirrhosis) and liver failure. Fatty liver is associated with insulin resistance, increased body fat, high blood pressure (hypertension), and high cholesterol. These are features of metabolic syndrome and increase your risk for stroke, diabetes, and heart disease. What are the causes? This condition may be caused by:  Drinking too much alcohol.  Poor nutrition.  Obesity.  Cushing's syndrome.  Diabetes.  High cholesterol.  Certain drugs.  Poisons.  Some viral infections.  Pregnancy. What increases the risk? You are more likely to develop this condition if you:  Abuse alcohol.  Are overweight.  Have diabetes.  Have hepatitis.  Have a high triglyceride level.  Are pregnant. What are the signs or symptoms? Fatty liver disease often does not cause symptoms. If symptoms do develop, they can include:  Fatigue.  Weakness.  Weight loss.  Confusion.  Abdominal pain.  Nausea and vomiting.  Yellowing of your skin and the white parts of your eyes (jaundice).  Itchy skin. How is this diagnosed? This condition may be diagnosed by:  A physical exam and medical history.  Blood tests.  Imaging tests, such as an ultrasound, CT scan, or MRI.  A liver biopsy. A small sample  of liver tissue is removed using a needle. The sample is then looked at under a microscope. How is this treated? Fatty liver disease is often caused by other health conditions. Treatment for fatty liver may involve medicines and lifestyle changes to manage conditions such as:  Alcoholism.  High cholesterol.  Diabetes.  Being overweight or obese. Follow these instructions at home:   Do not drink alcohol. If you have trouble quitting, ask your health care provider how to safely quit with the help of medicine or a supervised program. This is important to keep your condition from getting worse.  Eat a healthy diet as told by your health care provider. Ask your health care provider about working with a diet and nutrition specialist (dietitian) to develop an eating plan.  Exercise regularly. This can help you lose weight and control your cholesterol and diabetes. Talk to your health care provider about an exercise plan and which activities are best for you.  Take over-the-counter and prescription medicines only as told by your health care provider.  Keep all follow-up visits as told by your health care provider. This is important. Contact a health care provider if: You have trouble controlling your:  Blood sugar. This is especially important if you have diabetes.  Cholesterol.  Drinking of alcohol. Get help right away if:  You have abdominal pain.  You have jaundice.  You have nausea and vomiting.  You vomit blood or material that looks like coffee grounds.  You have stools that are black, tar-like, or bloody. Summary  Fatty liver disease develops when too much fat  builds up in the cells of your liver.  Fatty liver disease often causes no symptoms or problems. However, over time, fatty liver can cause inflammation that may lead to more serious liver problems, such as scarring of the liver (cirrhosis).  You are more likely to develop this condition if you abuse alcohol, are  pregnant, are overweight, have diabetes, have hepatitis, or have high triglyceride levels.  Contact your health care provider if you have trouble controlling your weight, blood sugar, cholesterol, or drinking of alcohol. This information is not intended to replace advice given to you by your health care provider. Make sure you discuss any questions you have with your health care provider. Document Released: 11/22/2005 Document Revised: 09/19/2017 Document Reviewed: 07/16/2017 Elsevier Patient Education  2020 [employer].    Hiatal Hernia  A hiatal hernia occurs when part of the stomach slides above the muscle that separates the abdomen from the chest (diaphragm). A person can be born with a hiatal hernia (congenital), or it may develop over time. In almost all cases of hiatal hernia, only the top part of the stomach pushes through the diaphragm. Many people have a hiatal hernia with no symptoms. The larger the hernia, the more likely it is that you will have symptoms. In some cases, a hiatal hernia allows stomach acid to flow back into the tube that carries food from your mouth to your stomach (esophagus). This may cause heartburn symptoms. Severe heartburn symptoms may mean that you have developed a condition called gastroesophageal reflux disease (GERD). What are the causes? This condition is caused by a weakness in the opening (hiatus) where the esophagus passes through the diaphragm to attach to the upper part of the stomach. A person may be born with a weakness in the hiatus, or a weakness can develop over time. What increases the risk? This condition is more likely to develop in:  Older people. Age is a major risk factor for a hiatal hernia, especially if you are over the age of 29.  Pregnant women.  People who are overweight.  People who have frequent constipation. What are the signs or symptoms? Symptoms of this condition usually develop in the form of GERD symptoms. Symptoms  include:  Heartburn.  Belching.  Indigestion.  Trouble swallowing.  Coughing or wheezing.  Sore throat.  Hoarseness.  Chest pain.  Nausea and vomiting. How is this diagnosed? This condition may be diagnosed during testing for GERD. Tests that may be done include:  X-rays of your stomach or chest.  An upper gastrointestinal (GI) series. This is an X-ray exam of your GI tract that is taken after you swallow a chalky liquid that shows up clearly on the X-ray.  Endoscopy. This is a procedure to look into your stomach using a thin, flexible tube that has a tiny camera and light on the end of it. How is this treated? This condition may be treated by:  Dietary and lifestyle changes to help reduce GERD symptoms.  Medicines. These may include: ? Over-the-counter antacids. ? Medicines that make your stomach empty more quickly. ? Medicines that block the production of stomach acid (H2 blockers). ? Stronger medicines to reduce stomach acid (proton pump inhibitors).  Surgery to repair the hernia, if other treatments are not helping. If you have no symptoms, you may not need treatment. Follow these instructions at home: Lifestyle and activity  Do not use any products that contain nicotine or tobacco, such as cigarettes and e-cigarettes. If you need help quitting,  ask your health care provider.  Try to achieve and maintain a healthy body weight.  Avoid putting pressure on your abdomen. Anything that puts pressure on your abdomen increases the amount of acid that may be pushed up into your esophagus. ? Avoid bending over, especially after eating. ? Raise the head of your bed by putting blocks under the legs. This keeps your head and esophagus higher than your stomach. ? Do not wear tight clothing around your chest or stomach. ? Try not to strain when having a bowel movement, when urinating, or when lifting heavy objects. Eating and drinking  Avoid foods that can worsen GERD  symptoms. These may include: ? Fatty foods, like fried foods. ? Citrus fruits, like oranges or lemon. ? Other foods and drinks that contain acid, like orange juice or tomatoes. ? Spicy food. ? Chocolate.  Eat frequent small meals instead of three large meals a day. This helps prevent your stomach from getting too full. ? Eat slowly. ? Do not lie down right after eating. ? Do not eat 1-2 hours before bed.  Do not drink beverages with caffeine. These include cola, coffee, cocoa, and tea.  Do not drink alcohol. General instructions  Take over-the-counter and prescription medicines only as told by your health care provider.  Keep all follow-up visits as told by your health care provider. This is important. Contact a health care provider if:  Your symptoms are not controlled with medicines or lifestyle changes.  You are having trouble swallowing.  You have coughing or wheezing that will not go away. Get help right away if:  Your pain is getting worse.  Your pain spreads to your arms, neck, jaw, teeth, or back.  You have shortness of breath.  You sweat for no reason.  You feel sick to your stomach (nauseous) or you vomit.  You vomit blood.  You have bright red blood in your stools.  You have black, tarry stools. This information is not intended to replace advice given to you by your health care provider. Make sure you discuss any questions you have with your health care provider. Document Released: 12/28/2003 Document Revised: 09/19/2017 Document Reviewed: 05/12/2017 Elsevier Patient Education  2020 [employer].

## 2019-06-09 NOTE — Assessment & Plan Note (Signed)
Appears to be MSK, due to exercise programs (belly dancing and "hot Hula")

## 2019-06-09 NOTE — Progress Notes (Signed)
Has an upper and lower GI scheduled for October with [facility].

## 2019-06-09 NOTE — Progress Notes (Signed)
Virtual Visit via [email or web address]  This visit type was conducted due to national recommendations for restrictions regarding the COVID-19 pandemic (e.g. social distancing).  This format is felt to be most appropriate for this patient at this time.  All issues noted in this document were discussed and addressed.  No physical exam was performed (except for noted visual exam findings with Video Visits).   I connected with@ on 06/09/19 at 10:00 AM EDT by a video enabled telemedicine application or telephone and verified that I am speaking with the correct person using two identifiers. Location patient: home Location provider: work or home office Persons participating in the virtual visit: patient, provider  I discussed the limitations, risks, security and privacy concerns of performing an evaluation and management service by telephone and the availability of in person appointments. I also discussed with the patient that there may be a patient responsible charge related to this service. The patient expressed understanding and agreed to proceed.    Reason for visit: follow up on ruq abd pain  Last seen by me March 2019  HPI:   Recurrent Right sided rib pain occurring laterally,  Had workup which included RUQ ultrasound followed by CT abdomen.  Imaging studies reviewed In detail.  .  Fatty liver diagnosis discussed  Enlargement of right kidney ruled out on CT  Obesity : has lost 8 lbs on Whole 30 Diet in 3 weeks Arthritis  pains imptoving with less sugar in diet. impaired fasitng glucose Hiatal hernia:  Behavioral modifications discussed   Shingrx vaccine discussed   ROS: See pertinent positives and negatives per HPI.  No past medical history on file.  Past Surgical History:  Procedure Laterality Date  . ABDOMINAL HYSTERECTOMY  2015  . BREAST BIOPSY Right 09/30/2013   benign    Family History  Problem Relation Age of Onset  . Heart attack Father   . Alcohol abuse Father   . Aortic  aneurysm Father   . Cancer Maternal Grandmother        ovarian  . Thyroid disease Mother     SOCIAL HX:  reports that she has never smoked. She has never used smokeless tobacco. She reports that she does not drink alcohol or use drugs. No current outpatient medications on file.  EXAM:  VITALS per patient if applicable:  GENERAL: alert, oriented, appears well and in no acute distress  HEENT: atraumatic, conjunttiva clear, no obvious abnormalities on inspection of external nose and ears  NECK: normal movements of the head and neck  LUNGS: on inspection no signs of respiratory distress, breathing rate appears normal, no obvious gross SOB, gasping or wheezing  CV: no obvious cyanosis  MS: moves all visible extremities without noticeable abnormality  PSYCH/NEURO: pleasant and cooperative, no obvious depression or anxiety, speech and thought processing grossly intact  ASSESSMENT AND PLAN:   Obesity (BMI 30-39.9) I have congratulated her in reduction of   BMI and encouraged  Continued weight loss with goal of 10% of body weigh over the next 6 months using a low glycemic index diet and regular exercise a minimum of 5 days per week.    Right-sided thoracic back pain Appears to be MSK, due to exercise programs (belly dancing and "hot Hula")  Hepatic steatosis Presumed by ultrasound changes  Current liver enzymes are normal and all modifiable risk factors including obesity, diabetes and hyperlipidemia have been addressed   Alternative medicine Requesting my opinion on her use of AM products called "Purium" which contain amino acids,  including L thearine, which have given her energy, and cherry juice, which has improved her sleep .     I discussed the assessment and treatment plan with the patient. The patient was provided an opportunity to ask questions and all were answered. The patient agreed with the plan and demonstrated an understanding of the instructions.   The patient was  advised to call back or seek an in-person evaluation if the symptoms worsen or if the condition fails to improve as anticipated.  I provided  25 minutes of non-face-to-face time during this encounter.   Crecencio Mc, MD

## 2019-06-09 NOTE — Assessment & Plan Note (Signed)
I have congratulated her in reduction of   BMI and encouraged  Continued weight loss with goal of 10% of body weigh over the next 6 months using a low glycemic index diet and regular exercise a minimum of 5 days per week.    

## 2019-06-09 NOTE — Assessment & Plan Note (Signed)
Presumed by ultrasound changes  Current liver enzymes are normal and all modifiable risk factors including obesity, diabetes and hyperlipidemia have been addressed

## 2019-06-09 NOTE — Assessment & Plan Note (Signed)
Requesting my opinion on her use of AM products called "Purium" which contain amino acids, including L thearine, which have given her energy, and cherry juice, which has improved her sleep .

## 2019-06-24 ENCOUNTER — Other Ambulatory Visit: Payer: Self-pay

## 2019-06-24 ENCOUNTER — Ambulatory Visit (INDEPENDENT_AMBULATORY_CARE_PROVIDER_SITE_OTHER): Payer: BC Managed Care – PPO

## 2019-06-24 VITALS — BP 126/80 | HR 64 | Ht 65.96 in | Wt 216.1 lb

## 2019-06-24 DIAGNOSIS — E669 Obesity, unspecified: Secondary | ICD-10-CM

## 2019-06-25 ENCOUNTER — Other Ambulatory Visit: Payer: Self-pay

## 2019-06-25 ENCOUNTER — Other Ambulatory Visit (INDEPENDENT_AMBULATORY_CARE_PROVIDER_SITE_OTHER): Payer: BC Managed Care – PPO

## 2019-06-25 DIAGNOSIS — K760 Fatty (change of) liver, not elsewhere classified: Secondary | ICD-10-CM

## 2019-06-25 DIAGNOSIS — Z79899 Other long term (current) drug therapy: Secondary | ICD-10-CM | POA: Diagnosis not present

## 2019-06-25 DIAGNOSIS — R7301 Impaired fasting glucose: Secondary | ICD-10-CM | POA: Diagnosis not present

## 2019-06-25 DIAGNOSIS — E039 Hypothyroidism, unspecified: Secondary | ICD-10-CM | POA: Diagnosis not present

## 2019-06-25 LAB — LIPID PANEL
Cholesterol: 201 mg/dL — ABNORMAL HIGH (ref 0–200)
HDL: 44.2 mg/dL (ref >39.00)
LDL Cholesterol: 142 mg/dL — ABNORMAL HIGH (ref 0–99)
NonHDL: 156.92
Total CHOL/HDL Ratio: 5
Triglycerides: 76 mg/dL (ref 0.0–149.0)
VLDL: 15.2 mg/dL (ref 0.0–40.0)

## 2019-06-25 LAB — COMPREHENSIVE METABOLIC PANEL
ALT: 20 U/L (ref 0–35)
AST: 17 U/L (ref 0–37)
Albumin: 4.4 g/dL (ref 3.5–5.2)
Alkaline Phosphatase: 53 U/L (ref 39–117)
BUN: 18 mg/dL (ref 6–23)
CO2: 27 mEq/L (ref 19–32)
Calcium: 9.6 mg/dL (ref 8.4–10.5)
Chloride: 103 mEq/L (ref 96–112)
Creatinine, Ser: 0.83 mg/dL (ref 0.40–1.20)
GFR: 72.65 mL/min (ref >60.00)
Glucose, Bld: 81 mg/dL (ref 70–99)
Potassium: 3.9 mEq/L (ref 3.5–5.1)
Sodium: 139 mEq/L (ref 135–145)
Total Bilirubin: 0.5 mg/dL (ref 0.2–1.2)
Total Protein: 7.1 g/dL (ref 6.0–8.3)

## 2019-06-25 LAB — HEMOGLOBIN A1C: Hgb A1c MFr Bld: 5.5 % (ref 4.6–6.5)

## 2019-06-25 LAB — TSH: TSH: 2.13 u[IU]/mL (ref 0.35–4.50)

## 2019-07-01 NOTE — Progress Notes (Addendum)
Patient presented for vitals check per MD order (see OV notes on 06/09/19).  Weight- 216.12 lbs  Height- 167.53 cm (5'6 ft)  Blood pressure-126/80  PR- 64  O2- 98  I have reviewed the above information and agree with above.  BP is at goal.  No changes needed   Deborra Medina, MD

## 2019-07-13 ENCOUNTER — Ambulatory Visit (INDEPENDENT_AMBULATORY_CARE_PROVIDER_SITE_OTHER): Payer: BC Managed Care – PPO | Admitting: *Deleted

## 2019-07-13 ENCOUNTER — Other Ambulatory Visit: Payer: Self-pay

## 2019-07-13 DIAGNOSIS — Z23 Encounter for immunization: Secondary | ICD-10-CM | POA: Diagnosis not present

## 2019-07-13 NOTE — Progress Notes (Signed)
Patient tolerated vaccine well. Voiced no concern VIS given

## 2019-07-22 ENCOUNTER — Other Ambulatory Visit: Admission: RE | Admit: 2019-07-22 | Payer: BC Managed Care – PPO | Source: Ambulatory Visit

## 2019-07-27 ENCOUNTER — Other Ambulatory Visit
Admission: RE | Admit: 2019-07-27 | Discharge: 2019-07-27 | Disposition: A | Discharge status: Home / Self Care | Payer: BC Managed Care – PPO | Source: Ambulatory Visit | Attending: Gastroenterology | Admitting: Gastroenterology

## 2019-07-27 ENCOUNTER — Other Ambulatory Visit: Payer: Self-pay

## 2019-07-27 DIAGNOSIS — Z01812 Encounter for preprocedural laboratory examination: Secondary | ICD-10-CM | POA: Diagnosis not present

## 2019-07-27 DIAGNOSIS — Z20828 Contact with and (suspected) exposure to other viral communicable diseases: Secondary | ICD-10-CM | POA: Insufficient documentation

## 2019-07-27 LAB — SARS CORONAVIRUS 2 (TAT 6-24 HRS): SARS Coronavirus 2: NEGATIVE

## 2019-07-29 ENCOUNTER — Encounter: Payer: Self-pay | Admitting: *Deleted

## 2019-07-30 ENCOUNTER — Ambulatory Visit
Admission: RE | Admit: 2019-07-30 | Discharge: 2019-07-30 | Disposition: A | Discharge status: Home / Self Care | Payer: BC Managed Care – PPO | Attending: Gastroenterology | Admitting: Gastroenterology

## 2019-07-30 ENCOUNTER — Ambulatory Visit: Payer: BC Managed Care – PPO | Admitting: Anesthesiology

## 2019-07-30 ENCOUNTER — Other Ambulatory Visit: Payer: Self-pay

## 2019-07-30 ENCOUNTER — Encounter
Admission: RE | Disposition: A | Discharge status: Home / Self Care | Payer: Self-pay | Source: Home / Self Care | Attending: Gastroenterology

## 2019-07-30 DIAGNOSIS — K259 Gastric ulcer, unspecified as acute or chronic, without hemorrhage or perforation: Secondary | ICD-10-CM | POA: Diagnosis not present

## 2019-07-30 DIAGNOSIS — Z1211 Encounter for screening for malignant neoplasm of colon: Secondary | ICD-10-CM | POA: Insufficient documentation

## 2019-07-30 DIAGNOSIS — K2100 Gastro-esophageal reflux disease with esophagitis, without bleeding: Secondary | ICD-10-CM | POA: Insufficient documentation

## 2019-07-30 DIAGNOSIS — K5730 Diverticulosis of large intestine without perforation or abscess without bleeding: Secondary | ICD-10-CM | POA: Insufficient documentation

## 2019-07-30 DIAGNOSIS — R1310 Dysphagia, unspecified: Secondary | ICD-10-CM | POA: Insufficient documentation

## 2019-07-30 DIAGNOSIS — K5790 Diverticulosis of intestine, part unspecified, without perforation or abscess without bleeding: Secondary | ICD-10-CM | POA: Diagnosis not present

## 2019-07-30 DIAGNOSIS — K2980 Duodenitis without bleeding: Secondary | ICD-10-CM | POA: Diagnosis not present

## 2019-07-30 DIAGNOSIS — K30 Functional dyspepsia: Secondary | ICD-10-CM | POA: Diagnosis not present

## 2019-07-30 DIAGNOSIS — Q438 Other specified congenital malformations of intestine: Secondary | ICD-10-CM | POA: Insufficient documentation

## 2019-07-30 DIAGNOSIS — R1013 Epigastric pain: Secondary | ICD-10-CM | POA: Insufficient documentation

## 2019-07-30 DIAGNOSIS — K640 First degree hemorrhoids: Secondary | ICD-10-CM | POA: Diagnosis not present

## 2019-07-30 DIAGNOSIS — K219 Gastro-esophageal reflux disease without esophagitis: Secondary | ICD-10-CM | POA: Diagnosis not present

## 2019-07-30 DIAGNOSIS — C189 Malignant neoplasm of colon, unspecified: Secondary | ICD-10-CM | POA: Diagnosis not present

## 2019-07-30 DIAGNOSIS — K648 Other hemorrhoids: Secondary | ICD-10-CM | POA: Diagnosis not present

## 2019-07-30 DIAGNOSIS — D132 Benign neoplasm of duodenum: Secondary | ICD-10-CM | POA: Diagnosis not present

## 2019-07-30 HISTORY — DX: Gastro-esophageal reflux disease without esophagitis: K21.9

## 2019-07-30 HISTORY — PX: COLONOSCOPY WITH PROPOFOL: SHX5780

## 2019-07-30 HISTORY — PX: ESOPHAGOGASTRODUODENOSCOPY (EGD) WITH PROPOFOL: SHX5813

## 2019-07-30 LAB — KOH PREP: KOH Prep: NONE SEEN

## 2019-07-30 SURGERY — COLONOSCOPY WITH PROPOFOL
Anesthesia: General

## 2019-07-30 MED ORDER — FENTANYL CITRATE (PF) 100 MCG/2ML IJ SOLN
INTRAMUSCULAR | Status: DC | PRN
  Administered 2019-07-30: 50 ug via INTRAVENOUS
  Administered 2019-07-30 (×2): 25 ug via INTRAVENOUS

## 2019-07-30 MED ORDER — LIDOCAINE HCL (PF) 2 % IJ SOLN
INTRAMUSCULAR | Status: DC | PRN
  Administered 2019-07-30: 100 mg

## 2019-07-30 MED ORDER — MIDAZOLAM HCL 2 MG/2ML IJ SOLN
INTRAMUSCULAR | Status: AC
  Filled 2019-07-30: qty 2

## 2019-07-30 MED ORDER — PROPOFOL 500 MG/50ML IV EMUL
INTRAVENOUS | Status: AC
  Filled 2019-07-30: qty 50

## 2019-07-30 MED ORDER — FENTANYL CITRATE (PF) 100 MCG/2ML IJ SOLN
INTRAMUSCULAR | Status: AC
  Filled 2019-07-30: qty 2

## 2019-07-30 MED ORDER — LIDOCAINE HCL (PF) 2 % IJ SOLN
INTRAMUSCULAR | Status: AC
  Filled 2019-07-30: qty 10

## 2019-07-30 MED ORDER — PROPOFOL 500 MG/50ML IV EMUL
INTRAVENOUS | Status: DC | PRN
  Administered 2019-07-30: 75 ug/kg/min via INTRAVENOUS

## 2019-07-30 MED ORDER — PROPOFOL 10 MG/ML IV BOLUS
INTRAVENOUS | Status: DC | PRN
  Administered 2019-07-30: 20 mg via INTRAVENOUS
  Administered 2019-07-30: 30 mg via INTRAVENOUS

## 2019-07-30 MED ORDER — SODIUM CHLORIDE 0.9 % IV SOLN
INTRAVENOUS | Status: DC
  Administered 2019-07-30 (×2): via INTRAVENOUS

## 2019-07-30 MED ORDER — SODIUM CHLORIDE (PF) 0.9 % IJ SOLN
INTRAMUSCULAR | Status: DC | PRN
  Administered 2019-07-30: 10 mL

## 2019-07-30 MED ORDER — MIDAZOLAM HCL 5 MG/5ML IJ SOLN
INTRAMUSCULAR | Status: DC | PRN
  Administered 2019-07-30: 2 mg via INTRAVENOUS

## 2019-07-30 NOTE — Anesthesia Post-op Follow-up Note (Signed)
Anesthesia QCDR form completed.        

## 2019-07-30 NOTE — Op Note (Signed)
[facility]astroenterology Patient Name: Kristin Davis Procedure Date: 07/30/2019 10:38 AM MRN: [id number] Account #: [id number] Date of Birth: Dec 19, 1970 Admit Type: Outpatient Age: 48 Room: [facility] ENDO ROOM 3 Gender: Female Note Status: Finalized Procedure:            Upper GI endoscopy Indications:          Dyspepsia, Odynophagia Providers:            Lollie Sails, MD Medicines:            Monitored Anesthesia Care Complications:        No immediate complications. Procedure:            Pre-Anesthesia Assessment:                       - ASA Grade Assessment: II - A patient with mild                        systemic disease.                       After obtaining informed consent, the endoscope was                        passed under direct vision. Throughout the procedure,                        the patient's blood pressure, pulse, and oxygen                        saturations were monitored continuously. The Endoscope                        was introduced through the mouth, and advanced to the                        fourth part of duodenum. The upper GI endoscopy was                        accomplished without difficulty. The patient tolerated                        the procedure well. Findings:      LA Grade C (one or more mucosal breaks continuous between tops of 2 or       more mucosal folds, less than 75% circumference) esophagitis with no       bleeding was found. Biopsies were taken with a cold forceps for       histology.      The entire examined stomach was normal. Biopsies were taken with a cold       forceps for histology.      Patchy mild inflammation characterized by possible gastric metaplasia       was found in the anterior duodenal bulb.      The exam of the duodenum was otherwise normal.      Biopsies were taken with a cold forceps in the second and third portion       of the duodenum for histology.      Minimal, white plaques were found  in the middle third of the esophagus.       Cells for cytology were obtained by brushing.  The exam of the esophagus was otherwise normal.      Biopsies were taken with a cold forceps at 26 cm from the incisors for       histology. Impression:           - LA Grade C erosive esophagitis. Biopsied.                       - Normal stomach. Biopsied.                       - Duodenitis.                       - Esophageal plaques were found, suspicious for                        candidiasis. Cells for cytology obtained.                       - Biopsies were taken with a cold forceps for histology                        in the third portion of the duodenum.                       - Biopsies were taken with a cold forceps for histology                        at 26 cm from the incisors. Recommendation:       - Use Aciphex (rabeprazole) 20 mg PO daily daily.                       - Return to GI clinic in 4 weeks. Procedure Code(s):    --- Professional ---                       [phone], Esophagogastroduodenoscopy, flexible, transoral;                        with biopsy, single or multiple Diagnosis Code(s):    --- Professional ---                       K20.8, Other esophagitis                       K29.80, Duodenitis without bleeding                       K22.9, Disease of esophagus, unspecified                       R10.13, Epigastric pain                       R13.10, Dysphagia, unspecified CPT copyright 2019 [employer]. All rights reserved. The codes documented in this report are preliminary and upon coder review may  be revised to meet current compliance requirements. Lollie Sails, MD 07/30/2019 11:28:59 AM This report has been signed electronically. Number of Addenda: 0 Note Initiated On: 07/30/2019 10:38 AM      [facility]

## 2019-07-30 NOTE — Op Note (Signed)
[facility] Patient Name: Kristin Davis Procedure Date: 07/30/2019 10:38 AM MRN: [id number] Account #: [id number] Date of Birth: 1971/02/25 Admit Type: Outpatient Age: 48 Room: [facility] ENDO ROOM 3 Gender: Female Note Status: Finalized Procedure:            Colonoscopy Indications:          Screening for colorectal malignant neoplasm, This is                        the patient's first colonoscopy Providers:            Lollie Sails, MD Medicines:            Monitored Anesthesia Care Complications:        No immediate complications. Procedure:            Pre-Anesthesia Assessment:                       - ASA Grade Assessment: II - A patient with mild                        systemic disease.                       After obtaining informed consent, the colonoscope was                        passed under direct vision. Throughout the procedure,                        the patient's blood pressure, pulse, and oxygen                        saturations were monitored continuously. The                        Colonoscope was introduced through the anus and                        advanced to the the cecum, identified by appendiceal                        orifice and ileocecal valve. The colonoscopy was                        performed with moderate difficulty due to restricted                        mobility of the colon and a tortuous colon. Successful                        completion of the procedure was aided by changing the                        patient to a supine position and changing the patient                        to a prone position. The patient tolerated the                        procedure well. The quality  of the bowel preparation                        was good. Findings:      A few small and large-mouthed diverticula were found in the sigmoid       colon, descending colon, transverse colon and ascending colon.      The sigmoid colon was  significantly redundant.      Non-bleeding hemorrhoids were found during retroflexion and during       anoscopy. The hemorrhoids were small and Grade I (internal hemorrhoids       that do not prolapse).      The exam was otherwise normal throughout the examined colon.      The digital rectal exam was normal. Impression:           - Diverticulosis in the sigmoid colon, in the                        descending colon, in the transverse colon and in the                        ascending colon.                       - Redundant colon.                       - Non-bleeding hemorrhoids.                       - No specimens collected. Recommendation:       - Discharge patient to home. Procedure Code(s):    --- Professional ---                       [phone], Colonoscopy, flexible; diagnostic, including                        collection of specimen(s) by brushing or washing, when                        performed (separate procedure) Diagnosis Code(s):    --- Professional ---                       Z12.11, Encounter for screening for malignant neoplasm                        of colon                       K64.0, First degree hemorrhoids                       K57.30, Diverticulosis of large intestine without                        perforation or abscess without bleeding                       Q43.8, Other specified congenital malformations of                        intestine CPT copyright 2019 American Medical Association. All rights reserved. The codes documented  in this report are preliminary and upon coder review may  be revised to meet current compliance requirements. Lollie Sails, MD 07/30/2019 11:52:59 AM This report has been signed electronically. Number of Addenda: 0 Note Initiated On: 07/30/2019 10:38 AM Scope Withdrawal Time: 0 hours 5 minutes 19 seconds  Total Procedure Duration: 0 hours 16 minutes 18 seconds       [facility]

## 2019-07-30 NOTE — Anesthesia Preprocedure Evaluation (Signed)
Anesthesia Evaluation  Patient identified by MRN, date of birth, ID band Patient awake    Reviewed: Allergy & Precautions, H&P , NPO status , Patient's Chart, lab work & pertinent test results, reviewed documented beta blocker date and time   History of Anesthesia Complications Negative for: history of anesthetic complications  Airway Mallampati: I  TM Distance: >3 FB Neck ROM: full    Dental  (+) Dental Advidsory Given   Pulmonary neg pulmonary ROS,    Pulmonary exam normal        Cardiovascular Exercise Tolerance: Good negative cardio ROS Normal cardiovascular exam     Neuro/Psych PSYCHIATRIC DISORDERS Anxiety negative neurological ROS     GI/Hepatic Neg liver ROS, GERD  ,  Endo/Other  negative endocrine ROS  Renal/GU negative Renal ROS  negative genitourinary   Musculoskeletal   Abdominal   Peds  Hematology negative hematology ROS (+)   Anesthesia Other Findings Past Medical History: No date: GERD (gastroesophageal reflux disease)   Reproductive/Obstetrics negative OB ROS                             Anesthesia Physical Anesthesia Plan  ASA: II  Anesthesia Plan: General   Post-op Pain Management:    Induction: Intravenous  PONV Risk Score and Plan: 3 and Propofol infusion and TIVA  Airway Management Planned: Natural Airway and Nasal Cannula  Additional Equipment:   Intra-op Plan:   Post-operative Plan:   Informed Consent: I have reviewed the patients History and Physical, chart, labs and discussed the procedure including the risks, benefits and alternatives for the proposed anesthesia with the patient or authorized representative who has indicated his/her understanding and acceptance.     Dental Advisory Given  Plan Discussed with: Anesthesiologist, CRNA and Surgeon  Anesthesia Plan Comments:         Anesthesia Quick Evaluation

## 2019-07-30 NOTE — Anesthesia Postprocedure Evaluation (Signed)
Anesthesia Post Note  Patient: Kristin Davis  Procedure(s) Performed: COLONOSCOPY WITH PROPOFOL (N/A ) ESOPHAGOGASTRODUODENOSCOPY (EGD) WITH PROPOFOL (N/A )  Patient location during evaluation: Endoscopy Anesthesia Type: General Level of consciousness: awake and alert Pain management: pain level controlled Vital Signs Assessment: post-procedure vital signs reviewed and stable Respiratory status: spontaneous breathing, nonlabored ventilation, respiratory function stable and patient connected to nasal cannula oxygen Cardiovascular status: blood pressure returned to baseline and stable Postop Assessment: no apparent nausea or vomiting Anesthetic complications: no     Last Vitals:  Vitals:   07/30/19 1213 07/30/19 1223  BP: 113/75 123/69  Pulse: 62 (!) 49  Resp: 17 14  Temp:    SpO2: 100% 100%    Last Pain:  Vitals:   07/30/19 1223  TempSrc:   PainSc: 0-No pain                 Precious Haws Piscitello

## 2019-07-30 NOTE — Transfer of Care (Signed)
Immediate Anesthesia Transfer of Care Note  Patient: Kristin Davis  Procedure(s) Performed: COLONOSCOPY WITH PROPOFOL (N/A ) ESOPHAGOGASTRODUODENOSCOPY (EGD) WITH PROPOFOL (N/A )  Patient Location: PACU  Anesthesia Type:General  Level of Consciousness: sedated  Airway & Oxygen Therapy: Patient Spontanous Breathing and Patient connected to nasal cannula oxygen  Post-op Assessment: Report given to RN and Post -op Vital signs reviewed and stable  Post vital signs: Reviewed and stable  Last Vitals:  Vitals Value Taken Time  BP    Temp    Pulse 70 07/30/19 1152  Resp 17 07/30/19 1152  SpO2 100 % 07/30/19 1152  Vitals shown include unvalidated device data.  Last Pain:  Vitals:   07/30/19 1026  TempSrc: Oral  PainSc: 0-No pain         Complications: No apparent anesthesia complications

## 2019-07-30 NOTE — H&P (Signed)
Outpatient short stay form Pre-procedure 07/30/2019 10:47 AM Lollie Sails MD  Primary Physician: Dr Deborra Medina  Reason for visit: EGD and colonoscopy  History of present illness: Patient is a 48 year old female presenting today for an EGD and colonoscopy.  Her colonoscopy is for colon screening purposes.  This is her first colonoscopy.  No family history of colon cancer or colon polyps that she is familiar with.  Is also presenting with an atypical odynophagia complaint and dyspepsia/GERD.  She states that when she eats products with sugar in it feels like it is raw for things to go down.  She does not regurgitate foods and denies any difficulty swallowing otherwise.  He avoids things with sugar in it because of the symptom.  He has been on Prilosec in the past however that gave her headache and she was unable to tolerate.  There is no family history of gallbladder problems.  CT scan done on 06/04/2019 shows a fatty liver without other lesions.  There is also a small hiatal hernia.  An abdominal ultrasound done on 05/28/2019 shows also fatty infiltration of the liver with focal fatty sparing.  Some minimal kidney change noted on this but negative on CT scan.  Patient had an EGD on 01/30/2011 and there was an apparent esophageal stenosis that was dilated at that time.  He has been screened for celiac which is negative and follows a bland diet otherwise.    Current Facility-Administered Medications:  .  0.9 %  sodium chloride infusion, , Intravenous, Continuous, Lollie Sails, MD, Last Rate: 20 mL/hr at 07/30/19 1030  Medications Prior to Admission  Medication Sig Dispense Refill Last Dose  . omeprazole (PRILOSEC) 20 MG capsule Take 20 mg by mouth daily.        No Known Allergies   Past Medical History:  Diagnosis Date  . GERD (gastroesophageal reflux disease)     Review of systems:      Physical Exam    Heart and lungs: Regular rate and rhythm without rub or gallop lungs are  bilaterally clear    HEENT: Normocephalic atraumatic eyes are anicteric    Other:    Pertinant exam for procedure: Soft nontender nondistended bowel sounds positive normoactive    Planned proceedures: EGD, colonoscopy and indicated procedures. I have discussed the risks benefits and complications of procedures to include not limited to bleeding, infection, perforation and the risk of sedation and the patient wishes to proceed.    Lollie Sails, MD Gastroenterology 07/30/2019  10:47 AM

## 2019-08-02 ENCOUNTER — Encounter: Payer: Self-pay | Admitting: Gastroenterology

## 2019-08-02 LAB — SURGICAL PATHOLOGY

## 2019-08-11 ENCOUNTER — Other Ambulatory Visit: Payer: Self-pay

## 2019-08-11 ENCOUNTER — Ambulatory Visit (INDEPENDENT_AMBULATORY_CARE_PROVIDER_SITE_OTHER): Payer: BC Managed Care – PPO

## 2019-08-11 DIAGNOSIS — Z23 Encounter for immunization: Secondary | ICD-10-CM

## 2019-08-13 ENCOUNTER — Ambulatory Visit (INDEPENDENT_AMBULATORY_CARE_PROVIDER_SITE_OTHER): Payer: BC Managed Care – PPO | Admitting: Internal Medicine

## 2019-08-13 ENCOUNTER — Encounter: Payer: Self-pay | Admitting: Internal Medicine

## 2019-08-13 ENCOUNTER — Other Ambulatory Visit: Payer: Self-pay

## 2019-08-13 VITALS — Ht 67.0 in | Wt 210.0 lb

## 2019-08-13 DIAGNOSIS — M609 Myositis, unspecified: Secondary | ICD-10-CM | POA: Diagnosis not present

## 2019-08-13 DIAGNOSIS — K9049 Malabsorption due to intolerance, not elsewhere classified: Secondary | ICD-10-CM

## 2019-08-13 NOTE — Progress Notes (Signed)
Virtual Visit via [email or web address]  This visit type was conducted due to national recommendations for restrictions regarding the COVID-19 pandemic (e.g. social distancing).  This format is felt to be most appropriate for this patient at this time.  All issues noted in this document were discussed and addressed.  No physical exam was performed (except for noted visual exam findings with Video Visits).   I connected with@ on 08/13/19 at  3:30 PM EDT by a video enabled telemedicine application and verified that I am speaking with the correct person using two identifiers. Location patient: home Location provider: work or home office Persons participating in the virtual visit: patient, provider  I discussed the limitations, risks, security and privacy concerns of performing an evaluation and management service by telephone and the availability of in person appointments. I also discussed with the patient that there may be a patient responsible charge related to this service. The patient expressed understanding and agreed to proceed.  Reason for visit: patient requesting "autoimmune and "allergy" testing   HPI:  48 yr old female with hypothyroidism, fatty liver/obesity and GAD presents with concerns about food allergies and automminune diseases following an episode of diarrhea that occurred after eating gluten free pizza with pineapple/ham/olive oil  2 episodes of moderate to severe myalgias following a day of unusual activity: 1) dancing for several hours at a wedding shower  2) picking up packing and moving boxes while  helping her  daughter.   She is concerned about MS,  RA, fibromyalgia.  And concerned about celiac disease    Reviewed symptoms and records .she has no reports of joint tenderness ,, redness or warmth,  EGD/colonoscpy done last week by [facility] Multiple biopsies were done and tere were no reports of celiac disease.  Started on aciphex for erosive esophagitis, and developed abd pain and  dizziness,  Which are both improving.    ROS: See pertinent positives and negatives per HPI.  Past Medical History:  Diagnosis Date  . GERD (gastroesophageal reflux disease)     Past Surgical History:  Procedure Laterality Date  . ABDOMINAL HYSTERECTOMY  2015  . BREAST BIOPSY Right 09/30/2013   benign  . COLONOSCOPY WITH PROPOFOL N/A 07/30/2019   Procedure: COLONOSCOPY WITH PROPOFOL;  Surgeon: Lollie Sails, MD;  Location[facility] ENDOSCOPY;  Service: Endoscopy;  Laterality: N/A;  . ESOPHAGOGASTRODUODENOSCOPY (EGD) WITH PROPOFOL N/A 07/30/2019   Procedure: ESOPHAGOGASTRODUODENOSCOPY (EGD) WITH PROPOFOL;  Surgeon: Lollie Sails, MD;  Location: [facility] ENDOSCOPY;  Service: Endoscopy;  Laterality: N/A;    Family History  Problem Relation Age of Onset  . Heart attack Father   . Alcohol abuse Father   . Aortic aneurysm Father   . Cancer Maternal Grandmother        ovarian  . Thyroid disease Mother     SOCIAL HX:  reports that she has never smoked. She has never used smokeless tobacco. She reports that she does not drink alcohol or use drugs.   Current Outpatient Medications:  .  RABEprazole (ACIPHEX) 20 MG tablet, TAKE 1 TABLET (20 MG TOTAL) BY MOUTH ONCE DAILY TAKE 30 MINUTES BEFORE BREAKFAST, Disp: , Rfl:   EXAM:  VITALS per patient if applicable:  GENERAL: alert, oriented, appears well and in no acute distress  HEENT: atraumatic, conjunttiva clear, no obvious abnormalities on inspection of external nose and ears  NECK: normal movements of the head and neck  LUNGS: on inspection no signs of respiratory distress, breathing rate appears normal, no obvious gross SOB,  gasping or wheezing  CV: no obvious cyanosis  MS: moves all visible extremities without noticeable abnormality  PSYCH/NEURO: pleasant and cooperative, no obvious depression or anxiety, speech and thought processing grossly intact  ASSESSMENT AND PLAN:  Discussed the following assessment and  plan:  Myositis of multiple sites, unspecified myositis type - Plan: Sedimentation rate, ANA w/Reflex if Positive, Rheumatoid Factor, Cyclic citrul peptide antibody, IgG, CK (Creatine Kinase)  Food intolerance in adult - Plan: Ambulatory referral to Allergy  Food intolerance in adult Celiac disease recently ruled out with small bowel biopsy  Referral to [facility] allergy  Myositis of multiple sites Screening for inflammatory diseases requested     I discussed the assessment and treatment plan with the patient. The patient was provided an opportunity to ask questions and all were answered. The patient agreed with the plan and demonstrated an understanding of the instructions.   The patient was advised to call back or seek an in-person evaluation if the symptoms worsen or if the condition fails to improve as anticipated.  I provided  25 minutes of non-face-to-face time during this encounter reviewing patient's current problems and post surgeries.  Providing counseling on the above mentioned problems , and coordination  of care .   Crecencio Mc, MD

## 2019-08-15 DIAGNOSIS — M609 Myositis, unspecified: Secondary | ICD-10-CM | POA: Insufficient documentation

## 2019-08-15 DIAGNOSIS — K9049 Malabsorption due to intolerance, not elsewhere classified: Secondary | ICD-10-CM | POA: Insufficient documentation

## 2019-08-15 NOTE — Assessment & Plan Note (Signed)
Celiac disease recently ruled out with small bowel biopsy  Referral to [facility]

## 2019-08-15 NOTE — Assessment & Plan Note (Signed)
Screening for inflammatory diseases requested

## 2019-08-30 DIAGNOSIS — K2100 Gastro-esophageal reflux disease with esophagitis, without bleeding: Secondary | ICD-10-CM | POA: Diagnosis not present

## 2019-09-04 DIAGNOSIS — Z23 Encounter for immunization: Secondary | ICD-10-CM | POA: Diagnosis not present

## 2019-09-07 DIAGNOSIS — K219 Gastro-esophageal reflux disease without esophagitis: Secondary | ICD-10-CM | POA: Diagnosis not present

## 2019-09-07 DIAGNOSIS — J3081 Allergic rhinitis due to animal (cat) (dog) hair and dander: Secondary | ICD-10-CM | POA: Diagnosis not present

## 2019-09-07 DIAGNOSIS — J3089 Other allergic rhinitis: Secondary | ICD-10-CM | POA: Diagnosis not present

## 2019-09-07 DIAGNOSIS — J301 Allergic rhinitis due to pollen: Secondary | ICD-10-CM | POA: Diagnosis not present

## 2019-09-28 ENCOUNTER — Other Ambulatory Visit: Payer: Self-pay

## 2019-09-28 ENCOUNTER — Encounter: Payer: Self-pay | Admitting: Emergency Medicine

## 2019-09-28 ENCOUNTER — Ambulatory Visit
Admission: EM | Admit: 2019-09-28 | Discharge: 2019-09-28 | Disposition: A | Discharge status: Home / Self Care | Payer: BC Managed Care – PPO

## 2019-09-28 DIAGNOSIS — R42 Dizziness and giddiness: Secondary | ICD-10-CM | POA: Diagnosis not present

## 2019-09-28 DIAGNOSIS — Z20828 Contact with and (suspected) exposure to other viral communicable diseases: Secondary | ICD-10-CM

## 2019-09-28 DIAGNOSIS — J302 Other seasonal allergic rhinitis: Secondary | ICD-10-CM

## 2019-09-28 DIAGNOSIS — B001 Herpesviral vesicular dermatitis: Secondary | ICD-10-CM

## 2019-09-28 DIAGNOSIS — H6593 Unspecified nonsuppurative otitis media, bilateral: Secondary | ICD-10-CM

## 2019-09-28 DIAGNOSIS — Z20822 Contact with and (suspected) exposure to covid-19: Secondary | ICD-10-CM

## 2019-09-28 MED ORDER — VALACYCLOVIR HCL 500 MG PO TABS: 2000.0000 mg | ORAL_TABLET | Freq: Two times a day (BID) | ORAL | 0 refills | Status: AC

## 2019-09-28 MED ORDER — MECLIZINE HCL 25 MG PO TABS: 25.0000 mg | ORAL_TABLET | Freq: Three times a day (TID) | ORAL | 0 refills | Status: DC | PRN

## 2019-09-28 NOTE — ED Provider Notes (Signed)
[place], [place]   Name: Kristin Davis DOB: [birth year] MRN: [id number] CSN: [id number] PCP: Crecencio Mc, MD  Arrival date and time:  09/28/19 [phone]  Chief Complaint:  Dizziness, Headache, and Cold sore   NOTE: Prior to seeing the patient today, I have reviewed the triage nursing documentation and vital signs. Clinical staff has updated patient's PMH/PSHx, current medication list, and drug allergies/intolerances to ensure comprehensive history available to assist in medical decision making.   History:   HPI: Kristin Davis is a 48 y.o. female who presents today with multiple complaints of as follows:  Headache and dizziness:  Patient presents with complaints of mild vertiginous symptoms that have been going on for the last week. Patient reports feeling "hung over"; denies ETOH intake. She has periods of what she describes as intermittent confusion with certain tasks. Patient reporting that her job is stressful. She states that she feels that this is what is causing her to find it hard to focus on tasks such as math. Patient reporting that she has been putting things in the wrong place while at work. She has retained the ability to perform her normal job duties. This morning, patient developed a generalized headache. She denies any weakness, visual changes, or ataxia. Patient drove herself to clinic today without difficulties. She has not experienced any chest pain, SOB, palpitations, or diaphoresis. Patient complains of paranasal and frontal sinus tenderness. She denies recent cough, rhinorrhea, and otalgia. Patient has a history of seasonal allergies, however she does not routinely take any sort of interventions. Patient denies being in close contact with anyone known to be ill. She has never been tested for SARS-CoV-2 (novel coronavirus) per her report.   Cold sore: Patient also with complains of a cold sore that developed yesterday. She notes that she gets these from time to time when she  is under a great deal of stress or eats a lot of citrus fruits. She had some old valacyclovir at home and took a dose yesterday, however she notes that the prescription had expired.   Past Medical History:  Diagnosis Date   GERD (gastroesophageal reflux disease)     Past Surgical History:  Procedure Laterality Date   ABDOMINAL HYSTERECTOMY  2015   BREAST BIOPSY Right 09/30/2013   benign   COLONOSCOPY WITH PROPOFOL N/A 07/30/2019   Procedure: COLONOSCOPY WITH PROPOFOL;  Surgeon: Lollie Sails, MD;  Location: [facility] ENDOSCOPY;  Service: Endoscopy;  Laterality: N/A;   ESOPHAGOGASTRODUODENOSCOPY (EGD) WITH PROPOFOL N/A 07/30/2019   Procedure: ESOPHAGOGASTRODUODENOSCOPY (EGD) WITH PROPOFOL;  Surgeon: Lollie Sails, MD;  Location[facility] ENDOSCOPY;  Service: Endoscopy;  Laterality: N/A;    Family History  Problem Relation Age of Onset   Heart attack Father    Alcohol abuse Father    Aortic aneurysm Father    Cancer Maternal Grandmother        ovarian   Thyroid disease Mother     Social History   Tobacco Use   Smoking status: Never Smoker   Smokeless tobacco: Never Used  Substance Use Topics   Alcohol use: No   Drug use: No    Patient Active Problem List   Diagnosis Date Noted   Myositis of multiple sites 08/15/2019   Food intolerance in adult 08/15/2019   S/P hysterectomy 06/09/2019   Hepatic steatosis 06/09/2019   Alternative medicine 06/09/2019   Hypothyroidism 11/18/2016   Generalized anxiety disorder 06/11/2016   Right-sided thoracic back pain 04/14/2014   Primary snoring 04/14/2014  History of abnormal mammogram 10/03/2013   Obesity (BMI 30-39.9) 11/13/2012    Home Medications:    Current Meds  Medication Sig   Amino Acids (AMINO ACID PO) Take by mouth.   [DISCONTINUED] valACYclovir (VALTREX) 500 MG tablet Take 500 mg by mouth as needed.    Allergies:   Sudafed [pseudoephedrine]  Review of Systems (ROS): Review of Systems   Constitutional: Negative for chills and fever.  HENT: Positive for sinus pressure and sinus pain. Negative for congestion, ear pain, nosebleeds, sore throat and tinnitus.        (+) cold sore  Respiratory: Negative for cough and shortness of breath.   Cardiovascular: Negative for chest pain and palpitations.  Gastrointestinal: Negative for nausea and vomiting.  Musculoskeletal: Negative for gait problem.  Allergic/Immunologic: Positive for environmental allergies (seasonal).  Neurological: Positive for dizziness and headaches. Negative for tremors, syncope, speech difficulty, weakness and numbness.  Psychiatric/Behavioral: The patient is nervous/anxious.        Increased work related stress; problems with focusing.  All other systems reviewed and are negative.    Vital Signs: Today's Vitals   09/28/19 1004 09/28/19 1008  BP:  123/83  Pulse:  81  Resp:  18  Temp:  98.4 F (36.9 C)  TempSrc:  Oral  SpO2:  98%  Weight: 215 lb (97.5 kg)   Height: 5\' 6"  (1.676 m)   PainSc: 7      Physical Exam: Physical Exam  Constitutional: She is oriented to person, place, and time and well-developed, well-nourished, and in no distress.  HENT:  Head: Normocephalic and atraumatic.  Right Ear: Hearing normal. Tympanic membrane is not erythematous. A middle ear effusion (serous) is present.  Left Ear: Hearing normal. Tympanic membrane is not erythematous. A middle ear effusion (serous) is present.  Nose: Mucosal edema and sinus tenderness (very mild) present. No rhinorrhea.  Mouth/Throat: Uvula is midline and mucous membranes are normal. Oral lesions (cold sore - upper lip) present. Posterior oropharyngeal erythema present. No oropharyngeal exudate or posterior oropharyngeal edema.  Eyes: Pupils are equal, round, and reactive to light. EOM are normal.  Neck: Normal range of motion. Neck supple. No tracheal deviation present.  Cardiovascular: Normal rate, regular rhythm, normal heart sounds and  intact distal pulses. Exam reveals no gallop and no friction rub.  No murmur heard. Pulmonary/Chest: Effort normal and breath sounds normal. No respiratory distress. She has no wheezes. She has no rales.  Neurological: She is alert and oriented to person, place, and time. She has normal motor skills, normal sensation, normal strength and intact cranial nerves. Gait normal. GCS score is 15.  Skin: Skin is warm and dry. No rash noted.  Psychiatric: Memory, affect and judgment normal. Her mood appears anxious.  Nursing note and vitals reviewed.   Urgent Care Treatments / Results:   LABS: PLEASE NOTE: all labs that were ordered this encounter are listed, however only abnormal results are displayed. Labs Reviewed  NOVEL CORONAVIRUS, NAA (HOSP ORDER, SEND-OUT TO REF LAB; TAT 18-24 HRS)    EKG: -None  RADIOLOGY: No results found.  PROCEDURES: Procedures  MEDICATIONS RECEIVED THIS VISIT: Medications - No data to display  PERTINENT CLINICAL COURSE NOTES/UPDATES:   Initial Impression / Assessment and Plan / Urgent Care Course:  Pertinent labs & imaging results that were available during my care of the patient were personally reviewed by me and considered in my medical decision making (see lab/imaging section of note for values and interpretations).  Kristin Davis is  a 48 y.o. female who presents to [facility] today with complaints of Dizziness, Headache, and Cold sore   Patient overall well appearing and in no acute distress today in clinic. Presenting symptoms (see HPI) and exam as documented above. She presents with symptoms associated with SARS-CoV-2 (novel coronavirus). Discussed typical symptom constellation. Reviewed potential for infection and need for testing. Patient amenable to being tested. SARS-CoV-2 swab collected by certified clinical staff. Discussed variable turn around times associated with testing, as swabs are being processed at [facility], and have been taking  between 2-5 days to come back. She was advised to self quarantine, per [facility] DHHS guidelines, until negative results received.   Symptoms persistent x 1 week. Exam reveals minimal sinus tenderness. She has mild BILATERAL serous MEEs. She is grossly NI with no focal neurological concerns. PMH (+) seasonal allergies for which she intermittently uses cetirizine. Encouraged to use cetirizine dose daily to help dry up ear effusions and improve her vertiginous symptoms. Will also send in prescription for a supply of meclizine for patient to use on a PRN basis. She was encouraged to make efforts to increase her fluid intake. May use Tylenol and/or Ibuprofen as needed for headache. We discussed at length that if symptom do not improve, or if she feels worse, we will need to consider further evaluation of her symptoms. She was encouraged to discuss with her PCP.   Regarding her cold sore. Patient has take a single tablet of expired valacyclovir. Will send in prescription for an appropriate course of treatment for oral HSV-1 lesions, which will necessitate patient take a 2 g dose BID for 1 day.   Discussed follow up with primary care physician in 2 days for re-evaluation if not improving. I have reviewed the follow up and strict return precautions for any new or worsening symptoms. Patient is aware of symptoms that would be deemed urgent/emergent, and would thus require further evaluation either here or in the emergency department. At the time of discharge, she verbalized understanding and consent with the discharge plan as it was reviewed with her. All questions were fielded by provider and/or clinic staff prior to patient discharge.    Final Clinical Impressions / Urgent Care Diagnoses:   Final diagnoses:  Vertigo  Fluid level behind tympanic membrane of both ears  Encounter for laboratory testing for COVID-19 virus  Seasonal allergies  Fever blister    New Prescriptions:  [place] Controlled Substance Registry  consulted? Not Applicable  Meds ordered this encounter  Medications   meclizine (ANTIVERT) 25 MG tablet    Sig: Take 1 tablet (25 mg total) by mouth 3 (three) times daily as needed for dizziness.    Dispense:  30 tablet    Refill:  0   valACYclovir (VALTREX) 500 MG tablet    Sig: Take 4 tablets (2,000 mg total) by mouth 2 (two) times daily for 1 day.    Dispense:  8 tablet    Refill:  0    Recommended Follow up Care:  Patient encouraged to follow up with the following provider within the specified time frame, or sooner as dictated by the severity of her symptoms. As always, she was instructed that for any urgent/emergent care needs, she should seek care either here or in the emergency department for more immediate evaluation.  Follow-up Information    Crecencio Mc, MD In 2 days.   Specialty: Internal Medicine Why: General reassessment of symptoms if not improving Contact information: [place]  [address] [address] [phone]         NOTE: This note was prepared using Dragon dictation software along with smaller phrase technology. Despite my best ability to proofread, there is the potential that transcriptional errors may still occur from this process, and are completely unintentional.    Karen Kitchens, NP 09/29/19 0045

## 2019-09-28 NOTE — ED Triage Notes (Signed)
Pt c/o dizziness, headache, intermittent confusion with certain task. She states that this started about a week ago. She states that she feels like the room is moving sometimes then when she closes her eyes she feels like she is spinning.

## 2019-09-28 NOTE — Discharge Instructions (Addendum)
It was very nice seeing you today in clinic. Thank you for entrusting me with your care.   Make sure you are staying hydrated. You have fluid behind your ears. You indicated that you have seasonal allergies. I would recommend adding Zyrtec daily. Will also send in some medication to help with your dizziness. Call Dr. Derrel Nip if not improving in the next couple of days, or sooner if needed.   You were tested for SARS-CoV-2 (novel coronavirus) today. Testing is performed by an outside lab ([facility]) and has variable turn around times ranging between 2-5 days. Current recommendations from the the CDC and [place] DHHS require that you remain out of work in order to quarantine at home until negative test results are have been received. In the event that your test results are positive, you will be contacted with further directives. These measures are being implemented out of an abundance of caution to prevent transmission and spread during the current SARS-CoV-2 pandemic.  If your symptoms/condition worsens, please seek follow up care either here or in the ER. Please remember, our [place] providers are "right here with you" when you need [place].   Again, it was my pleasure to take care of you today. Thank you for choosing our clinic. I hope that you start to feel better quickly.   Honor Loh, MSN, APRN, FNP-C, CEN Advanced Practice Provider [facility]

## 2019-09-29 ENCOUNTER — Encounter: Payer: Self-pay | Admitting: Internal Medicine

## 2019-09-29 ENCOUNTER — Ambulatory Visit (INDEPENDENT_AMBULATORY_CARE_PROVIDER_SITE_OTHER): Payer: BC Managed Care – PPO | Admitting: Internal Medicine

## 2019-09-29 DIAGNOSIS — H6690 Otitis media, unspecified, unspecified ear: Secondary | ICD-10-CM | POA: Insufficient documentation

## 2019-09-29 DIAGNOSIS — H6503 Acute serous otitis media, bilateral: Secondary | ICD-10-CM

## 2019-09-29 DIAGNOSIS — F411 Generalized anxiety disorder: Secondary | ICD-10-CM | POA: Diagnosis not present

## 2019-09-29 LAB — NOVEL CORONAVIRUS, NAA (HOSP ORDER, SEND-OUT TO REF LAB; TAT 18-24 HRS): SARS-CoV-2, NAA: NOT DETECTED

## 2019-09-29 MED ORDER — ALPRAZOLAM 0.25 MG PO TABS: 0.2500 mg | ORAL_TABLET | Freq: Every day | ORAL | 0 refills | Status: DC | PRN

## 2019-09-29 MED ORDER — PREDNISONE 10 MG PO TABS: ORAL_TABLET | ORAL | 0 refills | Status: DC

## 2019-09-29 MED ORDER — AMOXICILLIN-POT CLAVULANATE 875-125 MG PO TABS: 1.0000 | ORAL_TABLET | Freq: Two times a day (BID) | ORAL | 0 refills | Status: DC

## 2019-09-29 NOTE — Progress Notes (Signed)
Virtual Visit via [email or web address]  This visit type was conducted due to national recommendations for restrictions regarding the COVID-19 pandemic (e.g. social distancing).  This format is felt to be most appropriate for this patient at this time.  All issues noted in this document were discussed and addressed.  No physical exam was performed (except for noted visual exam findings with Video Visits).   I connected with@ on 09/29/19 at  9:30 AM EST by a video enabled telemedicine application  and verified that I am speaking with the correct person using two identifiers. Location patient: home Location provider: work or home office Persons participating in the virtual visit: patient, provider  I discussed the limitations, risks, security and privacy concerns of performing an evaluation and management service by telephone and the availability of in person appointments. I also discussed with the patient that there may be a patient responsible charge related to this service. The patient expressed understanding and agreed to proceed.  Reason for visit: follow up on muscle pain,  Reflux esophagitis, Post prandial diarrhea , recent ER visit for vertigo (yesterday)   HPI:  GERD:  Now on famotidine after having s/e from 2 PPIs   Patient reports frequent episodes of dizziness, sinus pressure, and  Developed a headache yesterday and was evaluated at Urgent Care , along with a Cold sore x 1 week, no fevers.  .  Exam noted bilateral effusions and sinusitis (mild) .  Advised to use a humidifier and antihistamine.  Along with rx for meclizine.   Has not tried it yet. COVID results are pending.  Works by herself ,  Has a separate entrance.   Feels overwhelmed at work . Gets c0nfused and then gets panicked  and anxious . Previous trials of zoloft have caused weight gain . Requesting a "prn medication"  ROS: See pertinent positives and negatives per HPI.  Past Medical History:  Diagnosis Date  . GERD (gastroesophageal  reflux disease)     Past Surgical History:  Procedure Laterality Date  . ABDOMINAL HYSTERECTOMY  2015  . BREAST BIOPSY Right 09/30/2013   benign  . COLONOSCOPY WITH PROPOFOL N/A 07/30/2019   Procedure: COLONOSCOPY WITH PROPOFOL;  Surgeon: Lollie Sails, MD;  Location: [facility] ENDOSCOPY;  Service: Endoscopy;  Laterality: N/A;  . ESOPHAGOGASTRODUODENOSCOPY (EGD) WITH PROPOFOL N/A 07/30/2019   Procedure: ESOPHAGOGASTRODUODENOSCOPY (EGD) WITH PROPOFOL;  Surgeon: Lollie Sails, MD;  Location: [facility] ENDOSCOPY;  Service: Endoscopy;  Laterality: N/A;    Family History  Problem Relation Age of Onset  . Heart attack Father   . Alcohol abuse Father   . Aortic aneurysm Father   . Cancer Maternal Grandmother        ovarian  . Thyroid disease Mother     SOCIAL HX: reports that she has never smoked. She has never used smokeless tobacco. She reports that she does not drink alcohol or use drugs.   Current Outpatient Medications:  .  Amino Acids (AMINO ACID PO), Take by mouth., Disp: , Rfl:  .  meclizine (ANTIVERT) 25 MG tablet, Take 1 tablet (25 mg total) by mouth 3 (three) times daily as needed for dizziness., Disp: 30 tablet, Rfl: 0 .  valACYclovir (VALTREX) 500 MG tablet, Take 4 tablets (2,000 mg total) by mouth 2 (two) times daily for 1 day., Disp: 8 tablet, Rfl: 0 .  ALPRAZolam (XANAX) 0.25 MG tablet, Take 1 tablet (0.25 mg total) by mouth daily as needed for anxiety., Disp: 20 tablet, Rfl: 0 .  amoxicillin-clavulanate (AUGMENTIN)  875-125 MG tablet, Take 1 tablet by mouth 2 (two) times daily., Disp: 14 tablet, Rfl: 0 .  predniSONE (DELTASONE) 10 MG tablet, 6 tablets on Day 1 , then reduce by 1 tablet daily until gone, Disp: 21 tablet, Rfl: 0  EXAM:  VITALS per patient if applicable:  GENERAL: alert, oriented, appears well and in no acute distress  HEENT: atraumatic, conjunttiva clear, no obvious abnormalities on inspection of external nose and ears  NECK: normal movements of the  head and neck  LUNGS: on inspection no signs of respiratory distress, breathing rate appears normal, no obvious gross SOB, gasping or wheezing  CV: no obvious cyanosis  MS: moves all visible extremities without noticeable abnormality  PSYCH/NEURO: pleasant and cooperative, no obvious depression or anxiety, speech and thought processing grossly intact  ASSESSMENT AND PLAN:  Discussed the following assessment and plan:  Generalized anxiety disorder  Bilateral acute serous otitis media, recurrence not specified  Generalized anxiety disorder Now with increased anxiety and panic attaces (hyperventilation symptoms and confusion) Recommend starting SSRI therapy for management of anxiety, but has deferred due to previous trials causing weight gain.  #20 alprazolam prescribed for prn use,  Will change to buspirone if refills needed   Otitis media Advised to continue antihistamine and add prednisone /augmentin in 24 hours if headache/era pain persists Daily use of a probiotic advised for 3 weeks.     I discussed the assessment and treatment plan with the patient. The patient was provided an opportunity to ask questions and all were answered. The patient agreed with the plan and demonstrated an understanding of the instructions.   The patient was advised to call back or seek an in-person evaluation if the symptoms worsen or if the condition fails to improve as anticipated.    I provided  25 minutes of non-face-to-face time during this encounter reviewing patient's current problems and past procedures/imaging studies, providing counseling on the above mentioned problems , and coordination  of care .  Crecencio Mc, MD

## 2019-09-29 NOTE — Assessment & Plan Note (Signed)
Advised to continue antihistamine and add prednisone /augmentin in 24 hours if headache/era pain persists Daily use of a probiotic advised for 3 weeks.

## 2019-09-29 NOTE — Assessment & Plan Note (Signed)
Now with increased anxiety and panic attaces (hyperventilation symptoms and confusion) Recommend starting SSRI therapy for management of anxiety, but has deferred due to previous trials causing weight gain.  #20 alprazolam prescribed for prn use,  Will change to buspirone if refills needed

## 2020-01-10 ENCOUNTER — Ambulatory Visit (INDEPENDENT_AMBULATORY_CARE_PROVIDER_SITE_OTHER): Payer: BC Managed Care – PPO | Admitting: *Deleted

## 2020-01-10 ENCOUNTER — Other Ambulatory Visit: Payer: Self-pay

## 2020-01-10 DIAGNOSIS — Z23 Encounter for immunization: Secondary | ICD-10-CM | POA: Diagnosis not present

## 2020-02-07 ENCOUNTER — Other Ambulatory Visit: Payer: Self-pay | Admitting: Internal Medicine

## 2020-02-07 DIAGNOSIS — Z1231 Encounter for screening mammogram for malignant neoplasm of breast: Secondary | ICD-10-CM

## 2020-03-09 ENCOUNTER — Ambulatory Visit
Admission: RE | Admit: 2020-03-09 | Discharge: 2020-03-09 | Disposition: A | Discharge status: Home / Self Care | Payer: BC Managed Care – PPO | Source: Ambulatory Visit | Attending: Internal Medicine | Admitting: Internal Medicine

## 2020-03-09 ENCOUNTER — Other Ambulatory Visit: Payer: Self-pay

## 2020-03-09 DIAGNOSIS — Z1231 Encounter for screening mammogram for malignant neoplasm of breast: Secondary | ICD-10-CM

## 2020-06-02 ENCOUNTER — Ambulatory Visit: Admit: 2020-06-02 | Discharge status: Absent without leave | Payer: Self-pay

## 2020-06-02 ENCOUNTER — Other Ambulatory Visit: Payer: Self-pay

## 2020-06-02 ENCOUNTER — Ambulatory Visit
Admission: EM | Admit: 2020-06-02 | Discharge: 2020-06-02 | Disposition: A | Discharge status: Home / Self Care | Payer: BC Managed Care – PPO | Attending: Internal Medicine | Admitting: Internal Medicine

## 2020-06-02 ENCOUNTER — Encounter: Payer: Self-pay | Admitting: Emergency Medicine

## 2020-06-02 DIAGNOSIS — Z20822 Contact with and (suspected) exposure to covid-19: Secondary | ICD-10-CM

## 2020-06-02 DIAGNOSIS — R058 Other specified cough: Secondary | ICD-10-CM

## 2020-06-02 DIAGNOSIS — R05 Cough: Secondary | ICD-10-CM

## 2020-06-02 DIAGNOSIS — U071 COVID-19: Secondary | ICD-10-CM | POA: Diagnosis not present

## 2020-06-02 MED ORDER — BENZONATATE 100 MG PO CAPS: 100.0000 mg | ORAL_CAPSULE | Freq: Three times a day (TID) | ORAL | 0 refills | Status: DC

## 2020-06-02 NOTE — Discharge Instructions (Addendum)
Please optimize your oral fluid intake Please quarantine until COVID-19 test results are available If COVID-19 test result is positive, we will give you further instructions as to the next steps. Take medications as directed If you have any worsening symptoms please return to the urgent care for reevaluation.

## 2020-06-02 NOTE — ED Triage Notes (Signed)
Patient states that her husband was diagnosed with COVID yesterday.  Patient c/o cough and sneezing that started this morning.  Patient denies fevers.

## 2020-06-03 LAB — SARS CORONAVIRUS 2 (TAT 6-24 HRS): SARS Coronavirus 2: POSITIVE — AB

## 2020-06-04 ENCOUNTER — Telehealth: Payer: Self-pay | Admitting: Unknown Physician Specialty

## 2020-06-04 NOTE — Telephone Encounter (Signed)
Called to discuss with patient about Covid symptoms and the use of a monoclonal antibody infusion for those with mild to moderate Covid symptoms and at a high risk of hospitalization.  Pt is qualified for this infusion at the [facility] due to BMI>35   Message left to call back  

## 2020-06-04 NOTE — ED Provider Notes (Signed)
[facility]    CSN: [id number] Arrival date & time: 06/02/20  1247      History   Chief Complaint Chief Complaint  Patient presents with  . Cough    COVID Exposure    HPI Kristin Davis is a 49 y.o. female comes to the urgent care for COVID-19 testing that she had exposure to COVID-19 positive individual. Patient's husband was diagnosed with COVID-19 infection yesterday. Patient started to experience cough, sneezing without any shortness of breath this morning. She denies any fevers, chills, nausea, vomiting or diarrhea.   HPI  Past Medical History:  Diagnosis Date  . GERD (gastroesophageal reflux disease)     Patient Active Problem List   Diagnosis Date Noted  . Otitis media 09/29/2019  . 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 Food intolerance in adult 08/15/2019  . S/P hysterectomy 06/09/2019  . Hepatic steatosis 06/09/2019  . Alternative medicine 06/09/2019  . Hypothyroidism 11/18/2016  . Generalized anxiety disorder 06/11/2016  . Right-sided thoracic back pain 04/14/2014  . Primary snoring 04/14/2014  . History of abnormal mammogram 10/03/2013  . Obesity (BMI 30-39.9) 11/13/2012    Past Surgical History:  Procedure Laterality Date  . ABDOMINAL HYSTERECTOMY  2015  . BREAST BIOPSY Right 09/30/2013   benign  . COLONOSCOPY WITH PROPOFOL N/A 07/30/2019   Procedure: COLONOSCOPY WITH PROPOFOL;  Surgeon: Lollie Sails, MD;  Location: [facility] ENDOSCOPY;  Service: Endoscopy;  Laterality: N/A;  . ESOPHAGOGASTRODUODENOSCOPY (EGD) WITH PROPOFOL N/A 07/30/2019   Procedure: ESOPHAGOGASTRODUODENOSCOPY (EGD) WITH PROPOFOL;  Surgeon: Lollie Sails, MD;  Location: [facility] ENDOSCOPY;  Service: Endoscopy;  Laterality: N/A;    OB History   No obstetric history on file.      Home Medications    Prior to Admission medications   Medication Sig Start Date End Date Taking? Authorizing Provider  Amino Acids (AMINO ACID PO) Take by mouth.    [provider]  benzonatate (TESSALON) 100 MG capsule Take 1 capsule (100 mg total) by mouth every 8 (eight) hours. 06/02/20   Chase Picket, MD  RABEprazole (ACIPHEX) 20 MG tablet TAKE 1 TABLET (20 MG TOTAL) BY MOUTH ONCE DAILY TAKE 30 MINUTES BEFORE BREAKFAST 07/30/19 09/28/19  [provider]    Family History Family History  Problem Relation Age of Onset  . Heart attack Father   . Alcohol abuse Father   . Aortic aneurysm Father   . Cancer Maternal Grandmother        ovarian  . Thyroid disease Mother     Social History Social History   Tobacco Use  . Smoking status: Never Smoker  . Smokeless tobacco: Never Used  Vaping Use  . Vaping Use: Never used  Substance Use Topics  . Alcohol use: No  . Drug use: No     Allergies   Cat hair extract and Sudafed [pseudoephedrine]   Review of Systems Review of Systems  Constitutional: Negative for activity change, chills, fatigue and fever.  Respiratory: Positive for cough. Negative for shortness of breath.   Gastrointestinal: Negative for abdominal pain, nausea and vomiting.     Physical Exam Triage Vital Signs ED Triage Vitals  Enc Vitals Group     BP 06/02/20 1319 131/89     Pulse Rate 06/02/20 1319 75     Resp 06/02/20 1319 14     Temp 06/02/20 1319 98.2 F (36.8 C)     Temp Source 06/02/20 1319 Oral     SpO2 06/02/20 1319  100 %     Weight 06/02/20 1315 210 lb (95.3 kg)     Height 06/02/20 1315 5' 6.5" (1.689 m)     Head Circumference --      Peak Flow --      Pain Score 06/02/20 1315 3     Pain Loc --      Pain Edu? --      Excl. in [place]? --    No data found.  Updated Vital Signs BP 131/89 (BP Location: Right Arm)   Pulse 75   Temp 98.2 F (36.8 C) (Oral)   Resp 14   Ht 5' 6.5" (1.689 m)   Wt 95.3 kg   LMP 10/28/2012   SpO2 100%   BMI 33.39 kg/m   Visual Acuity Right Eye Distance:   Left Eye Distance:   Bilateral Distance:    Right Eye Near:   Left Eye Near:    Bilateral Near:      Physical Exam Vitals and nursing note reviewed.  Constitutional:      General: She is not in acute distress.    Appearance: Normal appearance. She is not ill-appearing.  Cardiovascular:     Rate and Rhythm: Normal rate and regular rhythm.     Pulses: Normal pulses.     Heart sounds: Normal heart sounds.  Pulmonary:     Effort: Pulmonary effort is normal.     Breath sounds: Normal breath sounds.  Abdominal:     General: Bowel sounds are normal.     Palpations: Abdomen is soft.  Neurological:     Mental Status: She is alert.      UC Treatments / Results  Labs (all labs ordered are listed, but only abnormal results are displayed) Labs Reviewed  SARS CORONAVIRUS 2 (TAT 6-24 HRS) - Abnormal; Notable for the following components:      Result Value   SARS Coronavirus 2 POSITIVE (*)    All other components within normal limits    EKG   Radiology No results found.  Procedures Procedures (including critical care time)  Medications Ordered in UC Medications - No data to display  Initial Impression / Assessment and Plan / UC Course  I have reviewed the triage vital signs and the nursing notes.  Pertinent labs & imaging results that were available during my care of the patient were reviewed by me and considered in my medical decision making (see chart for details).     1. Cough with COVID-19 exposure: COVID-19 PCR test sent Patient is advised to quarantine until COVID-19 test results are available Patient is currently on vaccinated Vaccination medication was given Tylenol as needed for body aches and/or fever Return precautions given. Final Clinical Impressions(s) / UC Diagnoses   Final diagnoses:  Cough with exposure to COVID-19 virus     Discharge Instructions     Please optimize your oral fluid intake Please quarantine until COVID-19 test results are available If COVID-19 test result is positive, we will give you further instructions as to the next  steps. Take medications as directed If you have any worsening symptoms please return to the urgent care for reevaluation.   ED Prescriptions    Medication Sig Dispense Auth. Provider   benzonatate (TESSALON) 100 MG capsule Take 1 capsule (100 mg total) by mouth every 8 (eight) hours. 30 capsule Tymeir [place], Myrene Galas, MD     PDMP not reviewed this encounter.   Chase Picket, MD 06/04/20 [phone]

## 2020-06-05 ENCOUNTER — Telehealth: Payer: Self-pay | Admitting: Unknown Physician Specialty

## 2020-06-05 ENCOUNTER — Other Ambulatory Visit: Payer: Self-pay | Admitting: Unknown Physician Specialty

## 2020-06-05 DIAGNOSIS — E669 Obesity, unspecified: Secondary | ICD-10-CM

## 2020-06-05 DIAGNOSIS — U071 COVID-19: Secondary | ICD-10-CM

## 2020-06-05 NOTE — Telephone Encounter (Signed)
I connected by phone with Kristin Davis on 06/05/2020 at 3:43 PM to discuss the potential use of a new treatment for mild to moderate COVID-19 viral infection in non-hospitalized patients.  This patient is a 49 y.o. female that meets the FDA criteria for Emergency Use Authorization of COVID monoclonal antibody casirivimab/imdevimab.  Has a (+) direct SARS-CoV-2 viral test result  Has mild or moderate COVID-19   Is NOT hospitalized due to COVID-19  Is within 10 days of symptom onset  Has at least one of the high risk factor(s) for progression to severe COVID-19 and/or hospitalization as defined in EUA.  Specific high risk criteria : BMI > 25   I have spoken and communicated the following to the patient or parent/caregiver regarding COVID monoclonal antibody treatment:  1. FDA has authorized the emergency use for the treatment of mild to moderate COVID-19 in adults and pediatric patients with positive results of direct SARS-CoV-2 viral testing who are 83 years of age and older weighing at least 40 kg, and who are at high risk for progressing to severe COVID-19 and/or hospitalization.  2. The significant known and potential risks and benefits of COVID monoclonal antibody, and the extent to which such potential risks and benefits are unknown.  3. Information on available alternative treatments and the risks and benefits of those alternatives, including clinical trials.  4. Patients treated with COVID monoclonal antibody should continue to self-isolate and use infection control measures (e.g., wear mask, isolate, social distance, avoid sharing personal items, clean and disinfect "high touch" surfaces, and frequent handwashing) according to CDC guidelines.   5. The patient or parent/caregiver has the option to accept or refuse COVID monoclonal antibody treatment.  After reviewing this information with the patient, The patient agreed to proceed with receiving casirivimab\imdevimab infusion and  will be provided a copy of the Fact sheet prior to receiving the infusion. Kathrine Haddock 06/05/2020 3:43 PM

## 2020-06-06 ENCOUNTER — Ambulatory Visit (HOSPITAL_COMMUNITY): Payer: BC Managed Care – PPO

## 2020-06-11 ENCOUNTER — Ambulatory Visit (INDEPENDENT_AMBULATORY_CARE_PROVIDER_SITE_OTHER): Payer: BC Managed Care – PPO

## 2020-06-11 ENCOUNTER — Other Ambulatory Visit: Payer: Self-pay

## 2020-06-11 ENCOUNTER — Encounter: Payer: Self-pay | Admitting: Emergency Medicine

## 2020-06-11 ENCOUNTER — Ambulatory Visit
Admission: EM | Admit: 2020-06-11 | Discharge: 2020-06-11 | Disposition: A | Discharge status: Home / Self Care | Payer: BC Managed Care – PPO | Attending: Physician Assistant | Admitting: Physician Assistant

## 2020-06-11 DIAGNOSIS — R0600 Dyspnea, unspecified: Secondary | ICD-10-CM | POA: Diagnosis not present

## 2020-06-11 DIAGNOSIS — R05 Cough: Secondary | ICD-10-CM

## 2020-06-11 DIAGNOSIS — J1282 Pneumonia due to coronavirus disease 2019: Secondary | ICD-10-CM

## 2020-06-11 DIAGNOSIS — R0602 Shortness of breath: Secondary | ICD-10-CM

## 2020-06-11 DIAGNOSIS — J189 Pneumonia, unspecified organism: Secondary | ICD-10-CM | POA: Diagnosis not present

## 2020-06-11 DIAGNOSIS — U071 COVID-19: Secondary | ICD-10-CM | POA: Diagnosis not present

## 2020-06-11 MED ORDER — BENZONATATE 200 MG PO CAPS: 200.0000 mg | ORAL_CAPSULE | Freq: Three times a day (TID) | ORAL | 0 refills | Status: AC | PRN

## 2020-06-11 MED ORDER — ALBUTEROL SULFATE HFA 108 (90 BASE) MCG/ACT IN AERS: 1.0000 | INHALATION_SPRAY | Freq: Four times a day (QID) | RESPIRATORY_TRACT | 0 refills | Status: DC | PRN

## 2020-06-11 NOTE — ED Triage Notes (Signed)
Patient has been taking OTC Mucinex.

## 2020-06-11 NOTE — ED Triage Notes (Signed)
Patient in today c/o continued cough and sob x 9 days. Patient tested positive for covid on 06/02/20 and her symptoms for covid started on 06/02/20.

## 2020-06-11 NOTE — Discharge Instructions (Signed)
-Your x-ray indicates COVID pneumonia today. Your vitals are all normal today and your chest was clear on auscultation. You likely still have some inflammation in your chest from your viral infection. Continue Mucinex and fluids. F/u for fever, chest pain, weakness, increased SOB. If acute worsening of symptoms go to ED  You have received COVID testing today either for positive exposure, concerning symptoms that could be related to COVID infection, screening purposes, or re-testing after confirmed positive.  Your test obtained today checks for active viral infection in the last 1-2 weeks. If your test is negative now, you can still test positive later. So, if you do develop symptoms you should either get re-tested and/or isolate x 10 days. Please follow CDC guidelines.  While Rapid antigen tests come back in 15-20 minutes, send out PCR/molecular test results typically come back within 24 hours. In the mean time, if you are symptomatic, assume this could be a positive test and treat/monitor yourself as if you do have COVID.   We will call with test results. Please download the MyChart app and set up a profile to access test results.   If symptomatic, go home and rest. Push fluids. Take Tylenol as needed for discomfort. Gargle warm salt water. Throat lozenges. Take Mucinex DM or Robitussin for cough. Humidifier in bedroom to ease coughing. Warm showers. Also review the COVID handout for more information.  COVID-19 INFECTION: The incubation period of COVID-19 is approximately 14 days after exposure, with most symptoms developing in roughly 4-5 days. Symptoms may range in severity from mild to critically severe. Roughly 80% of those infected will have mild symptoms. People of any age may become infected with COVID-19 and have the ability to transmit the virus. The most common symptoms include: fever, fatigue, cough, body aches, headaches, sore throat, nasal congestion, shortness of breath, nausea, vomiting,  diarrhea, changes in smell and/or taste.    COURSE OF ILLNESS Some patients may begin with mild disease which can progress quickly into critical symptoms. If your symptoms are worsening please call ahead to the Emergency Department and proceed there for further treatment. Recovery time appears to be roughly 1-2 weeks for mild symptoms and 3-6 weeks for severe disease.   GO IMMEDIATELY TO ER FOR FEVER YOU ARE UNABLE TO GET DOWN WITH TYLENOL, BREATHING PROBLEMS, CHEST PAIN, FATIGUE, LETHARGY, INABILITY TO EAT OR DRINK, ETC  QUARANTINE AND ISOLATION: To help decrease the spread of COVID-19 please remain isolated if you have COVID infection or are highly suspected to have COVID infection. This means -stay home and isolate to one room in the home if you live with others. Do not share a bed or bathroom with others while ill, sanitize and wipe down all countertops and keep common areas clean and disinfected. You may discontinue isolation if you have a mild case and are asymptomatic 10 days after symptom onset as long as you have been fever free >24 hours without having to take Motrin or Tylenol. If your case is more severe (meaning you develop pneumonia or are admitted in the hospital), you may have to isolate longer.   If you have been in close contact (within 6 feet) of someone diagnosed with COVID 19, you are advised to quarantine in your home for 14 days as symptoms can develop anywhere from 2-14 days after exposure to the virus. If you develop symptoms, you  must isolate.  Most current guidelines for COVID after exposure -isolate 10 days if you ARE NOT tested for COVID as  long as symptoms do not develop -isolate 7 days if you are tested and remain asymptomatic -You do not necessarily need to be tested for COVID if you have + exposure and        develop   symptoms. Just isolate at home x10 days from symptom onset During this global pandemic, CDC advises to practice social distancing, try to stay at least  69ft away from others at all times. Wear a face covering. Wash and sanitize your hands regularly and avoid going anywhere that is not necessary.  KEEP IN MIND THAT THE COVID TEST IS NOT 100% ACCURATE AND YOU SHOULD STILL DO EVERYTHING TO PREVENT POTENTIAL SPREAD OF VIRUS TO OTHERS (WEAR MASK, WEAR GLOVES, [place] HANDS AND SANITIZE REGULARLY). IF INITIAL TEST IS NEGATIVE, THIS MAY NOT MEAN YOU ARE DEFINITELY NEGATIVE. MOST ACCURATE TESTING IS DONE 5-7 DAYS AFTER EXPOSURE.   It is not advised by CDC to get re-tested after receiving a positive COVID test since you can still test positive for weeks to months after you have already cleared the virus.   *If you have not been vaccinated for COVID, I strongly suggest you consider getting vaccinated as long as there are no contraindications.

## 2020-06-11 NOTE — ED Provider Notes (Signed)
[facility]    CSN: [id number] Arrival date & time: 06/11/20  1505      History   Chief Complaint Chief Complaint  Patient presents with  . covid positive  . Cough  . Shortness of Breath    HPI Kristin Davis is a 49 y.o. female.   49 y/o female with COVID + history presents for check up. She says she has noticed wheezing over the past couple of days. She has been taking Mucinex with some improvement in symptoms. Denies fever. She says she has shortness of breath at night but not during the day and is not experiencing SOB now. She was scheduled for monoclonal antibody therapy, but did not go. Patient says that overall she is feeling better, but wanted to get the "wheezing" checked out. No other concerns today.     Past Medical History:  Diagnosis Date  . GERD (gastroesophageal reflux disease)     Patient Active Problem List   Diagnosis Date Noted  . Otitis media 09/29/2019  . 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 Food intolerance in adult 08/15/2019  . S/P hysterectomy 06/09/2019  . Hepatic steatosis 06/09/2019  . Alternative medicine 06/09/2019  . Hypothyroidism 11/18/2016  . Generalized anxiety disorder 06/11/2016  . Right-sided thoracic back pain 04/14/2014  . Primary snoring 04/14/2014  . History of abnormal mammogram 10/03/2013  . Obesity (BMI 30-39.9) 11/13/2012    Past Surgical History:  Procedure Laterality Date  . ABDOMINAL HYSTERECTOMY  2015  . BREAST BIOPSY Right 09/30/2013   benign  . COLONOSCOPY WITH PROPOFOL N/A 07/30/2019   Procedure: COLONOSCOPY WITH PROPOFOL;  Surgeon: Lollie Sails, MD;  Location: [facility] ENDOSCOPY;  Service: Endoscopy;  Laterality: N/A;  . ESOPHAGOGASTRODUODENOSCOPY (EGD) WITH PROPOFOL N/A 07/30/2019   Procedure: ESOPHAGOGASTRODUODENOSCOPY (EGD) WITH PROPOFOL;  Surgeon: Lollie Sails, MD;  Location: [facility] ENDOSCOPY;  Service: Endoscopy;  Laterality: N/A;    OB History   No obstetric history on file.        Home Medications    Prior to Admission medications   Medication Sig Start Date End Date Taking? Authorizing Provider  albuterol (VENTOLIN HFA) 108 (90 Base) MCG/ACT inhaler Inhale 1-2 puffs into the lungs every 6 (six) hours as needed for up to 10 days for wheezing or shortness of breath. 06/11/20 06/21/20  Laurene Footman B, PA-C  Amino Acids (AMINO ACID PO) Take by mouth.    [provider]  benzonatate (TESSALON) 200 MG capsule Take 1 capsule (200 mg total) by mouth 3 (three) times daily as needed for up to 7 days for cough. 06/11/20 06/18/20  Laurene Footman B, PA-C  RABEprazole (ACIPHEX) 20 MG tablet TAKE 1 TABLET (20 MG TOTAL) BY MOUTH ONCE DAILY TAKE 30 MINUTES BEFORE BREAKFAST 07/30/19 09/28/19  [provider]    Family History Family History  Problem Relation Age of Onset  . Heart attack Father   . Alcohol abuse Father   . Aortic aneurysm Father   . Cancer Maternal Grandmother        ovarian  . Thyroid disease Mother     Social History Social History   Tobacco Use  . Smoking status: Never Smoker  . Smokeless tobacco: Never Used  Vaping Use  . Vaping Use: Never used  Substance Use Topics  . Alcohol use: No  . Drug use: No     Allergies   Cat hair extract and Sudafed [pseudoephedrine]   Review of Systems Review of Systems  Constitutional: Negative  for chills, diaphoresis, fatigue and fever.  HENT: Negative for congestion, ear pain, rhinorrhea, sinus pressure, sinus pain and sore throat.   Respiratory: Positive for cough, shortness of breath and wheezing.   Cardiovascular: Negative for chest pain.  Gastrointestinal: Negative for abdominal pain, nausea and vomiting.  Musculoskeletal: Negative for arthralgias and myalgias.  Skin: Negative for rash.  Neurological: Negative for weakness and headaches.  Hematological: Negative for adenopathy.     Physical Exam Triage Vital Signs ED Triage Vitals  Enc Vitals Group     BP 06/11/20 1546 117/89      Pulse Rate 06/11/20 1546 81     Resp 06/11/20 1546 18     Temp 06/11/20 1546 98.1 F (36.7 C)     Temp Source 06/11/20 1546 Oral     SpO2 06/11/20 1546 98 %     Weight 06/11/20 1545 205 lb (93 kg)     Height 06/11/20 1545 5' 6.5" (1.689 m)     Head Circumference --      Peak Flow --      Pain Score 06/11/20 1545 0     Pain Loc --      Pain Edu? --      Excl. in Micro? --    No data found.  Updated Vital Signs BP 117/89 (BP Location: Left Arm)   Pulse 81   Temp 98.1 F (36.7 C) (Oral)   Resp 18   Ht 5' 6.5" (1.689 m)   Wt 205 lb (93 kg)   LMP 10/28/2012   SpO2 98%   BMI 32.59 kg/m        Physical Exam Vitals and nursing note reviewed.  Constitutional:      General: She is not in acute distress.    Appearance: Normal appearance. She is not ill-appearing or toxic-appearing.  HENT:     Head: Normocephalic and atraumatic.     Nose: Nose normal.     Mouth/Throat:     Mouth: Mucous membranes are moist.     Pharynx: Oropharynx is clear.  Eyes:     General: No scleral icterus.       Right eye: No discharge.        Left eye: No discharge.     Conjunctiva/sclera: Conjunctivae normal.  Cardiovascular:     Rate and Rhythm: Normal rate and regular rhythm.     Heart sounds: Normal heart sounds.  Pulmonary:     Effort: Pulmonary effort is normal. No respiratory distress.     Breath sounds: Normal breath sounds. No wheezing, rhonchi or rales.  Musculoskeletal:     Cervical back: Neck supple.  Skin:    General: Skin is dry.  Neurological:     General: No focal deficit present.     Mental Status: She is alert. Mental status is at baseline.     Motor: No weakness.     Gait: Gait normal.  Psychiatric:        Mood and Affect: Mood normal.        Behavior: Behavior normal.        Thought Content: Thought content normal.      UC Treatments / Results  Labs (all labs ordered are listed, but only abnormal results are displayed) Labs Reviewed - No data to  display  EKG   Radiology DG Chest 2 View  Result Date: 06/11/2020 CLINICAL DATA:  Dyspnea, cough, COVID-19 positive EXAM: CHEST - 2 VIEW COMPARISON:  None. FINDINGS: Normal heart size. Normal mediastinal contour.  No pneumothorax. No pleural effusion. Mild patchy opacities in the peripheral lungs bilaterally, left greater than right. IMPRESSION: Mild patchy peripheral lung opacities bilaterally, left greater than right, compatible with COVID-19 pneumonia. Electronically Signed   By: Ilona Sorrel M.D.   On: 06/11/2020 16:51    Procedures Procedures (including critical care time)  Medications Ordered in UC Medications - No data to display  Initial Impression / Assessment and Plan / UC Course  I have reviewed the triage vital signs and the nursing notes.  Pertinent labs & imaging results that were available during my care of the patient were reviewed by me and considered in my medical decision making (see chart for details).   Imaging today reveals COVID pneumonia. Chest is CTA and all VSS today. Advised patient this is a viral pneumonia and treatment is supportive with Mucienx, fluids, benzonatate at bed time, ProAir to use as needed. She should follow up as needed if not continuing to feel better over the next week. If she has fever, increased weakness, chest pain, increased SOB, or any new/worsening symptoms. Patient understanding and agreeable.   Final Clinical Impressions(s) / UC Diagnoses   Final diagnoses:  COVID-19  Shortness of breath  Pneumonia due to COVID-19 virus     Discharge Instructions     -Your x-ray indicates COVID pneumonia today. Your vitals are all normal today and your chest was clear on auscultation. You likely still have some inflammation in your chest from your viral infection. Continue Mucinex and fluids. F/u for fever, chest pain, weakness, increased SOB. If acute worsening of symptoms go to ED  You have received COVID testing today either for positive  exposure, concerning symptoms that could be related to COVID infection, screening purposes, or re-testing after confirmed positive.  Your test obtained today checks for active viral infection in the last 1-2 weeks. If your test is negative now, you can still test positive later. So, if you do develop symptoms you should either get re-tested and/or isolate x 10 days. Please follow CDC guidelines.  While Rapid antigen tests come back in 15-20 minutes, send out PCR/molecular test results typically come back within 24 hours. In the mean time, if you are symptomatic, assume this could be a positive test and treat/monitor yourself as if you do have COVID.   We will call with test results. Please download the MyChart app and set up a profile to access test results.   If symptomatic, go home and rest. Push fluids. Take Tylenol as needed for discomfort. Gargle warm salt water. Throat lozenges. Take Mucinex DM or Robitussin for cough. Humidifier in bedroom to ease coughing. Warm showers. Also review the COVID handout for more information.  COVID-19 INFECTION: The incubation period of COVID-19 is approximately 14 days after exposure, with most symptoms developing in roughly 4-5 days. Symptoms may range in severity from mild to critically severe. Roughly 80% of those infected will have mild symptoms. People of any age may become infected with COVID-19 and have the ability to transmit the virus. The most common symptoms include: fever, fatigue, cough, body aches, headaches, sore throat, nasal congestion, shortness of breath, nausea, vomiting, diarrhea, changes in smell and/or taste.    COURSE OF ILLNESS Some patients may begin with mild disease which can progress quickly into critical symptoms. If your symptoms are worsening please call ahead to the Emergency Department and proceed there for further treatment. Recovery time appears to be roughly 1-2 weeks for mild symptoms and 3-6 weeks for severe  disease.   GO  IMMEDIATELY TO ER FOR FEVER YOU ARE UNABLE TO GET DOWN WITH TYLENOL, BREATHING PROBLEMS, CHEST PAIN, FATIGUE, LETHARGY, INABILITY TO EAT OR DRINK, ETC  QUARANTINE AND ISOLATION: To help decrease the spread of COVID-19 please remain isolated if you have COVID infection or are highly suspected to have COVID infection. This means -stay home and isolate to one room in the home if you live with others. Do not share a bed or bathroom with others while ill, sanitize and wipe down all countertops and keep common areas clean and disinfected. You may discontinue isolation if you have a mild case and are asymptomatic 10 days after symptom onset as long as you have been fever free >24 hours without having to take Motrin or Tylenol. If your case is more severe (meaning you develop pneumonia or are admitted in the hospital), you may have to isolate longer.   If you have been in close contact (within 6 feet) of someone diagnosed with COVID 19, you are advised to quarantine in your home for 14 days as symptoms can develop anywhere from 2-14 days after exposure to the virus. If you develop symptoms, you  must isolate.  Most current guidelines for COVID after exposure -isolate 10 days if you ARE NOT tested for COVID as long as symptoms do not develop -isolate 7 days if you are tested and remain asymptomatic -You do not necessarily need to be tested for COVID if you have + exposure and        develop   symptoms. Just isolate at home x10 days from symptom onset During this global pandemic, CDC advises to practice social distancing, try to stay at least 68ft away from others at all times. Wear a face covering. Wash and sanitize your hands regularly and avoid going anywhere that is not necessary.  KEEP IN MIND THAT THE COVID TEST IS NOT 100% ACCURATE AND YOU SHOULD STILL DO EVERYTHING TO PREVENT POTENTIAL SPREAD OF VIRUS TO OTHERS (WEAR MASK, WEAR GLOVES, [place] HANDS AND SANITIZE REGULARLY). IF INITIAL TEST IS NEGATIVE, THIS MAY  NOT MEAN YOU ARE DEFINITELY NEGATIVE. MOST ACCURATE TESTING IS DONE 5-7 DAYS AFTER EXPOSURE.   It is not advised by CDC to get re-tested after receiving a positive COVID test since you can still test positive for weeks to months after you have already cleared the virus.   *If you have not been vaccinated for COVID, I strongly suggest you consider getting vaccinated as long as there are no contraindications.       ED Prescriptions    Medication Sig Dispense Auth. Provider   albuterol (VENTOLIN HFA) 108 (90 Base) MCG/ACT inhaler Inhale 1-2 puffs into the lungs every 6 (six) hours as needed for up to 10 days for wheezing or shortness of breath. 6.7 g Laurene Footman B, PA-C   benzonatate (TESSALON) 200 MG capsule Take 1 capsule (200 mg total) by mouth 3 (three) times daily as needed for up to 7 days for cough. 21 capsule Danton Clap, PA-C     PDMP not reviewed this encounter.   Danton Clap, PA-C 06/11/20 1717

## 2020-08-05 IMAGING — US ULTRASOUND ABDOMEN COMPLETE
1 series · 13 of 25 positions shown · non-contrast
Comparison: CT 08/22/2003 report.

CLINICAL DATA: Epigastric pain.

EXAM:
ABDOMEN ULTRASOUND COMPLETE

[Series 1: ultrasound abdomen complete · 0.22mm/px · 13 of 105 slices shown]
[im 1/105]
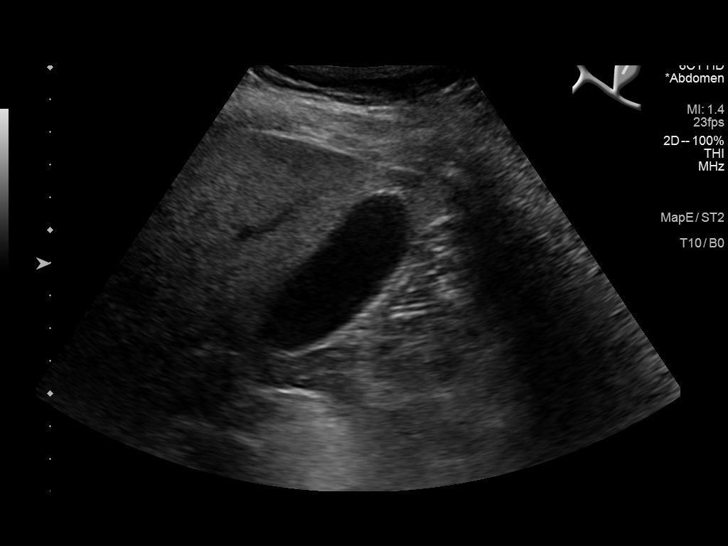
[im 9/105]
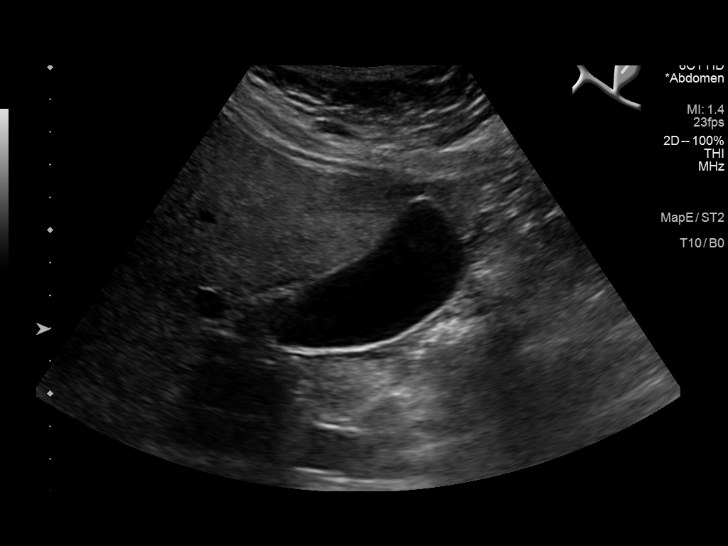
[im 18/105]
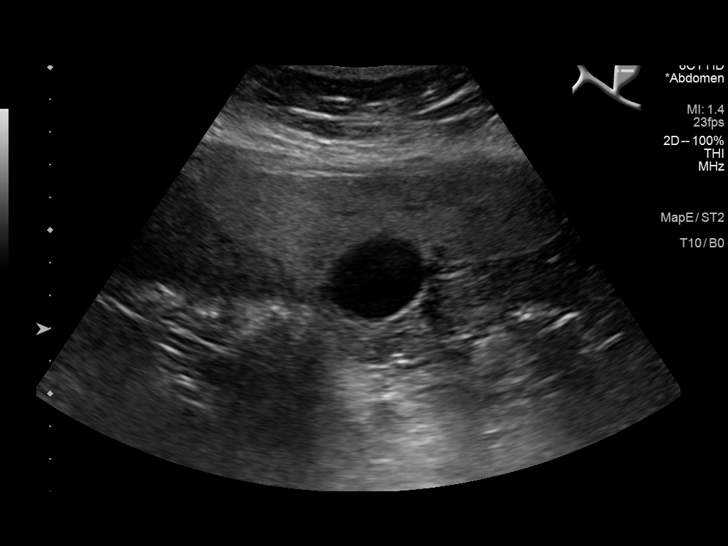
[im 27/105]
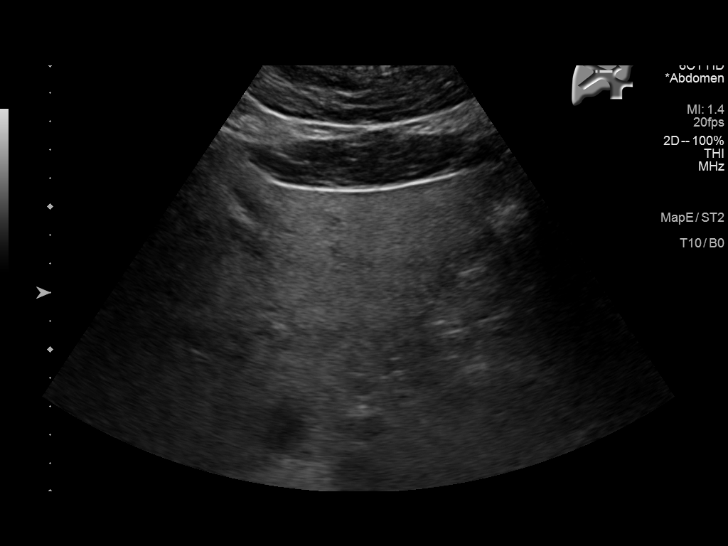
[im 35/105]
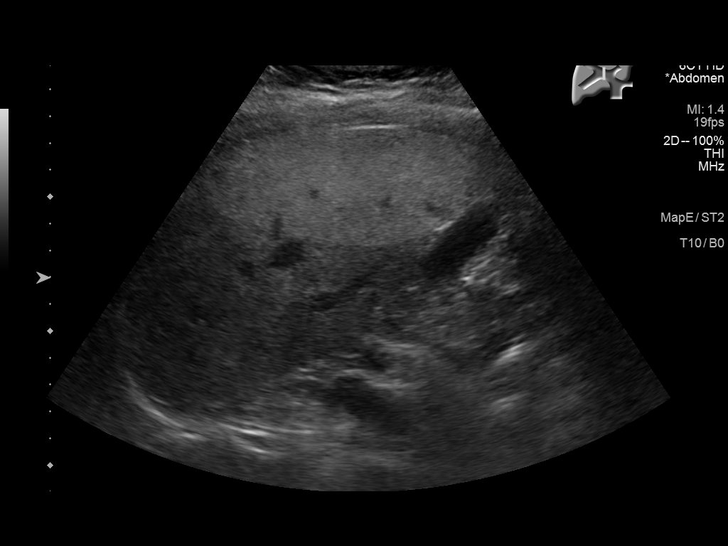
[im 44/105]
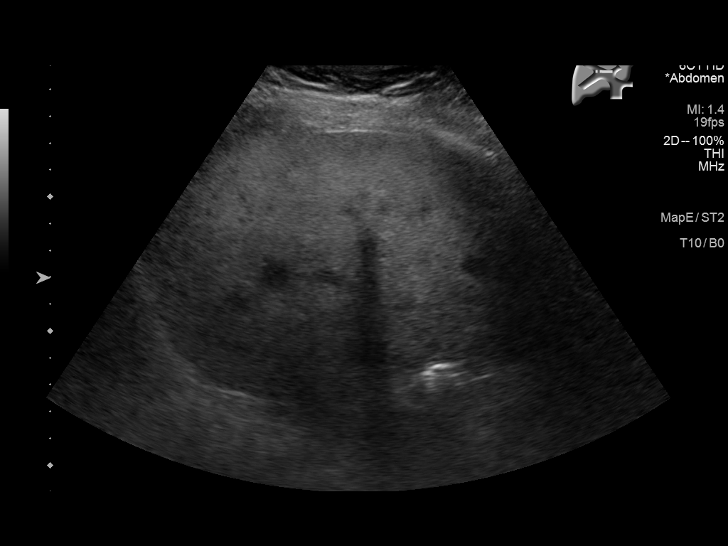
[im 53/105]
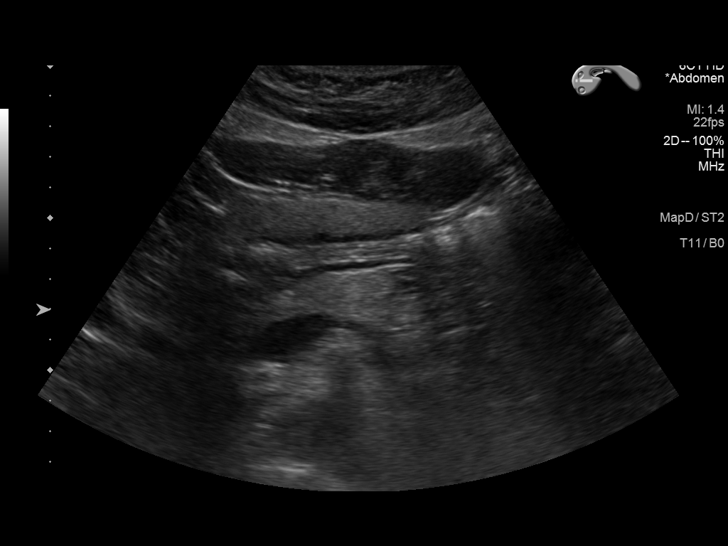
[im 61/105]
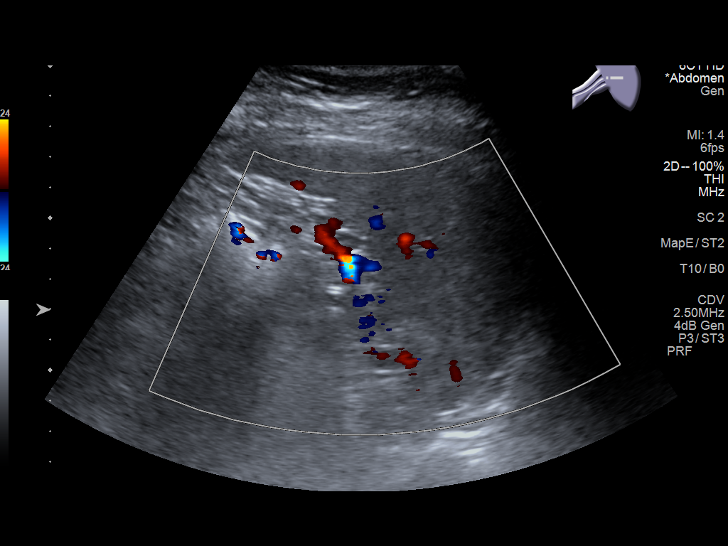
[im 70/105]
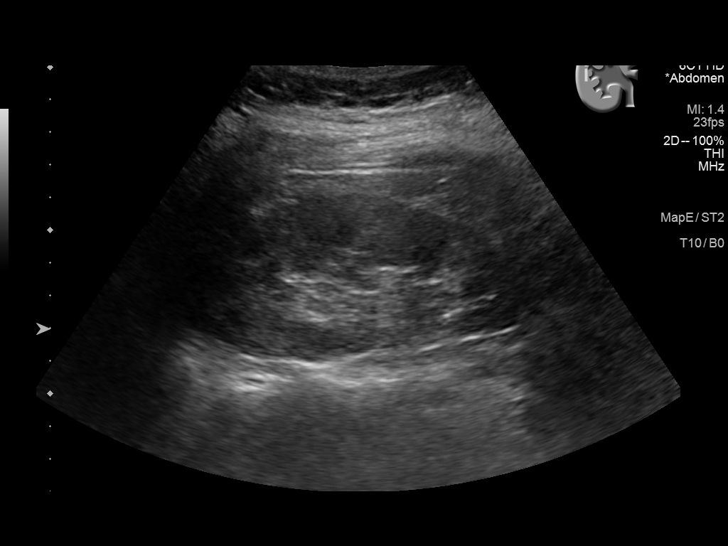
[im 79/105]
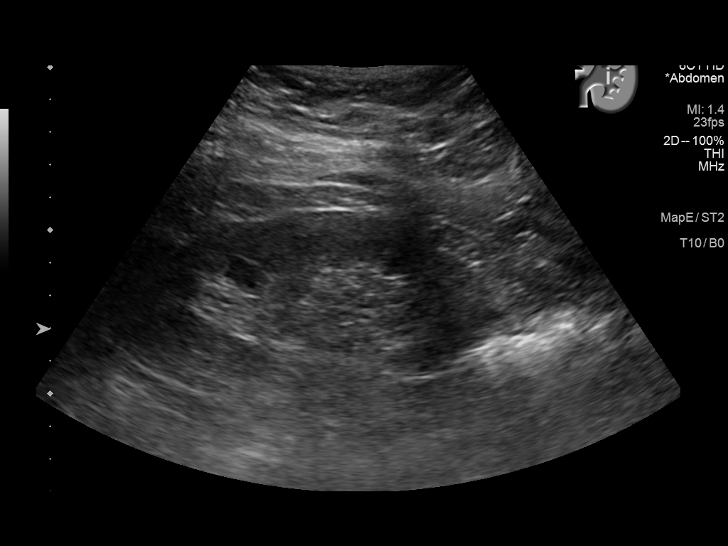
[im 87/105]
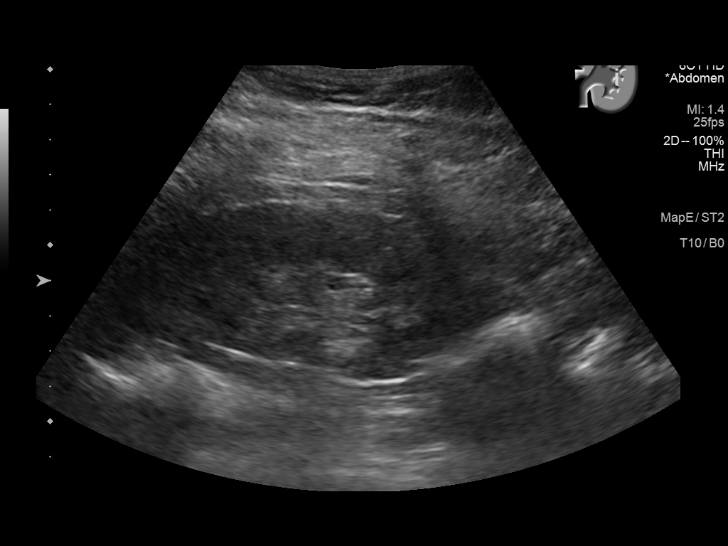
[im 96/105]
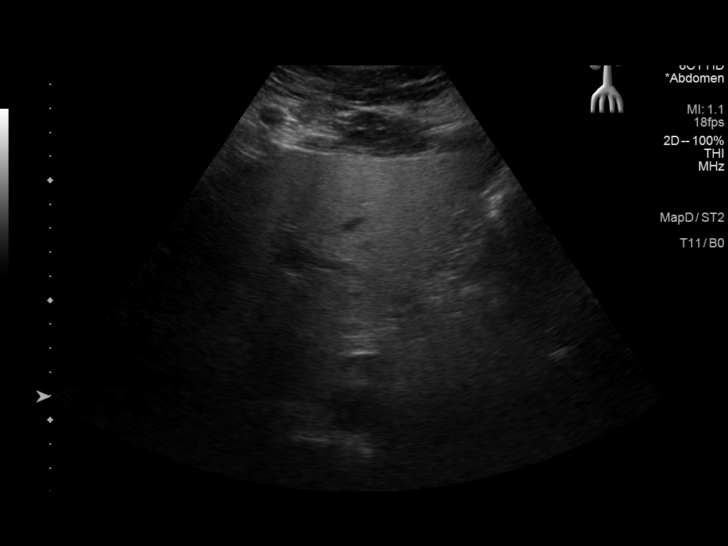
[im 105/105]
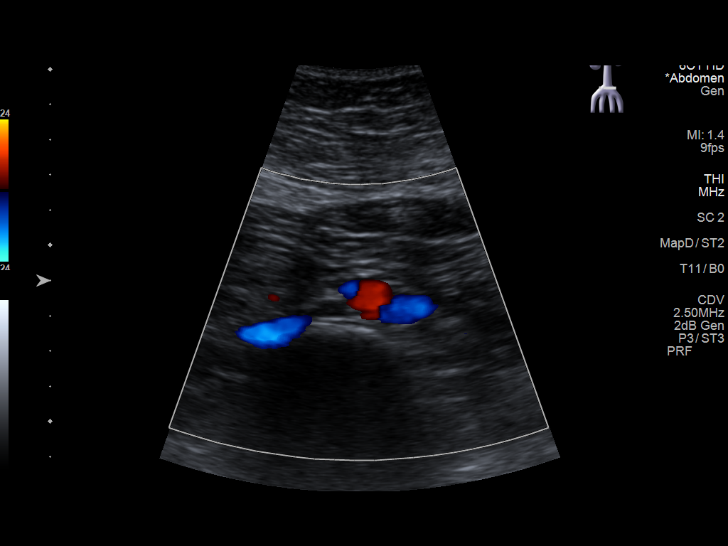

[13 of 25 positions shown; findings below may reference images not displayed]

FINDINGS: Gallbladder: No gallstones or wall thickening visualized. No
sonographic Murphy sign noted by sonographer.

Common bile duct: Diameter: 3.7 mm

Liver: Heterogeneous parenchymal pattern most consistent with fatty
infiltration with focal fatty sparing. No hepatic mass. Portal vein
is patent on color Doppler imaging with normal direction of blood
flow towards the liver.

IVC: No abnormality visualized.

Pancreas: Visualized portion unremarkable.

Spleen: Size and appearance within normal limits.

Right Kidney: Length: 10.4 cm. Echogenicity within normal limits. No
mass. Mild fullness of the right renal pelvis. Extrarenal pelvis
versus very mild hydronephrosis could present this fashion.

Left Kidney: Length: 10.4 cm. Echogenicity within normal limits. No
mass or hydronephrosis visualized.

Abdominal aorta: No aneurysm visualized.

Other findings: None.
IMPRESSION: 1. Heterogeneous hepatic parenchymal pattern consistent with fatty
infiltration with focal fatty sparing. No gallstones or biliary
distention. 2. Mild fullness right renal pelvis. This consistent
with extrarenal pelvis. Mild right hydronephrosis cannot be excluded

## 2020-08-12 IMAGING — CT CT ABDOMEN AND PELVIS WITHOUT AND WITH CONTRAST
1 of 5 series · 10 of 32 positions shown, 16 images · IV contrast (omnipaque)
Comparison: Ultrasound exam 05/28/2019

CLINICAL DATA: Right upper quadrant pain. Abnormal appearance of
right kidney on recent ultrasound.

EXAM:
CT ABDOMEN AND PELVIS WITHOUT AND WITH CONTRAST
TECHNIQUE: Multidetector CT imaging of the abdomen and pelvis was performed
following the standard protocol before and following the bolus
administration of intravenous contrast.
CONTRAST:  100mL OMNIPAQUE IOHEXOL 300 MG/ML  SOLN

[Series 2: axial st · axial · 0.72mm/px · z∈[-554,-174]mm · 10 of 94 slices shown, 16 images]
[im 9/94  soft-tissue]
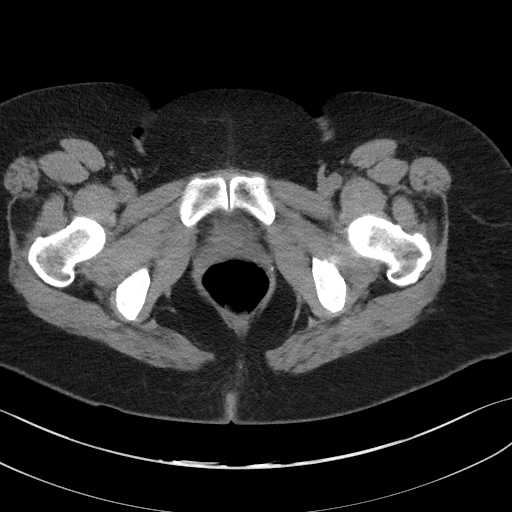
[im 9/94  bone]
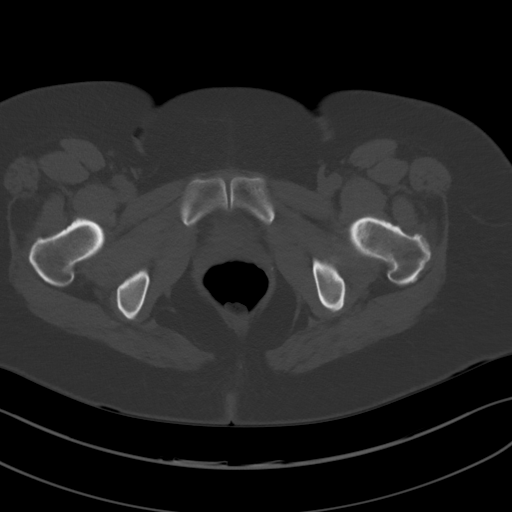
[im 17/94  soft-tissue]
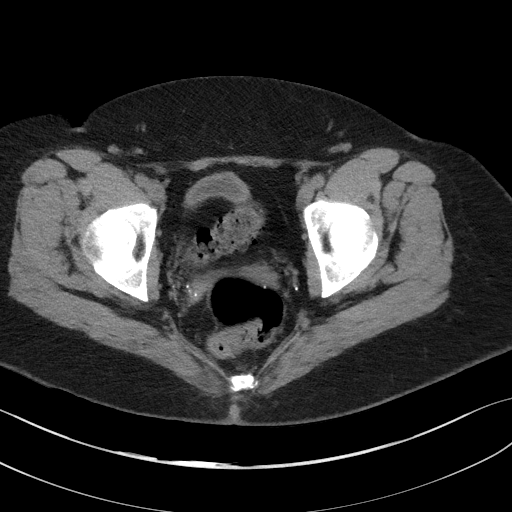
[im 26/94  soft-tissue]
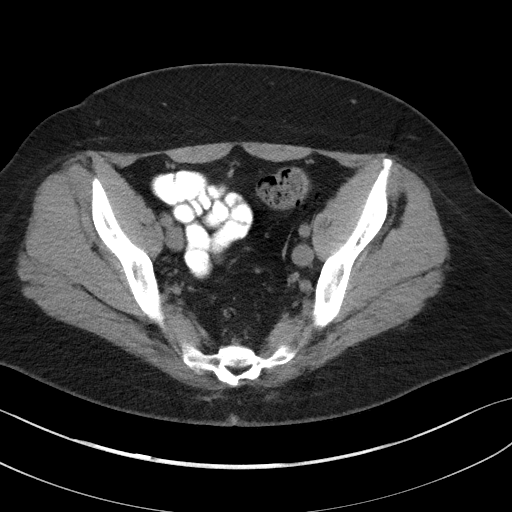
[im 34/94  soft-tissue]
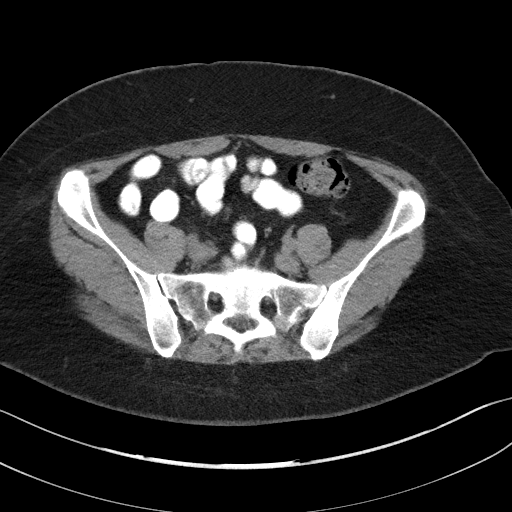
[im 43/94  soft-tissue]
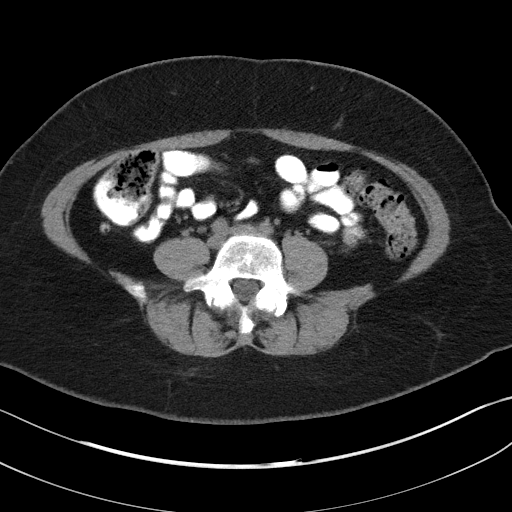
[im 51/94  soft-tissue]
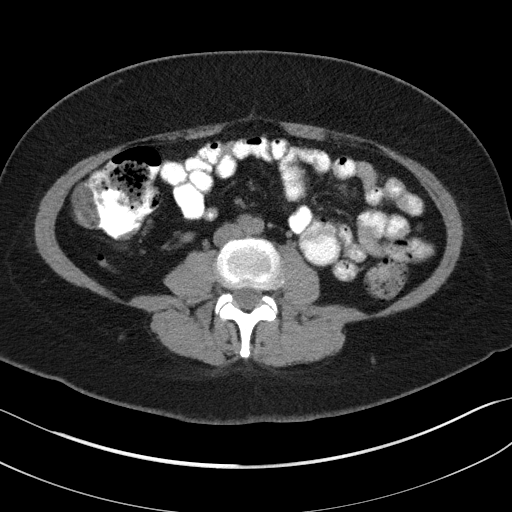
[im 60/94  soft-tissue]
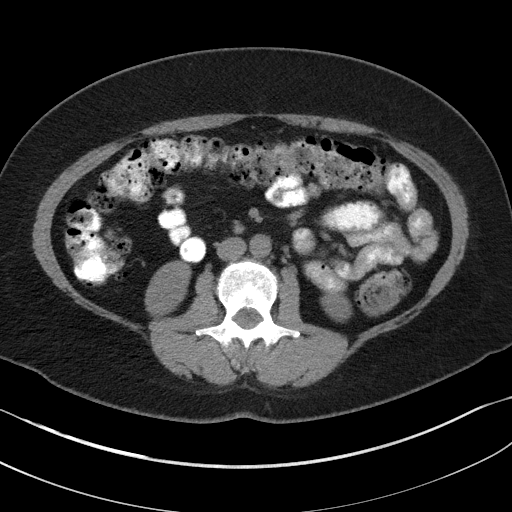
[im 60/94  lung]
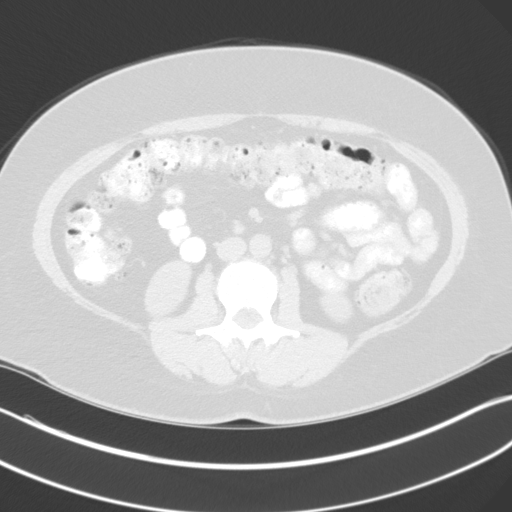
[im 68/94  soft-tissue]
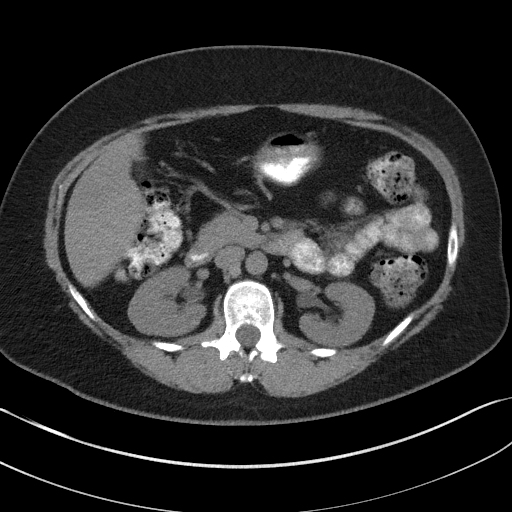
[im 68/94  lung]
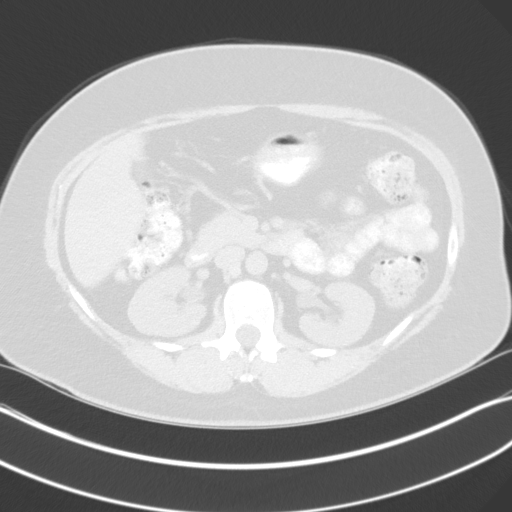
[im 77/94  soft-tissue]
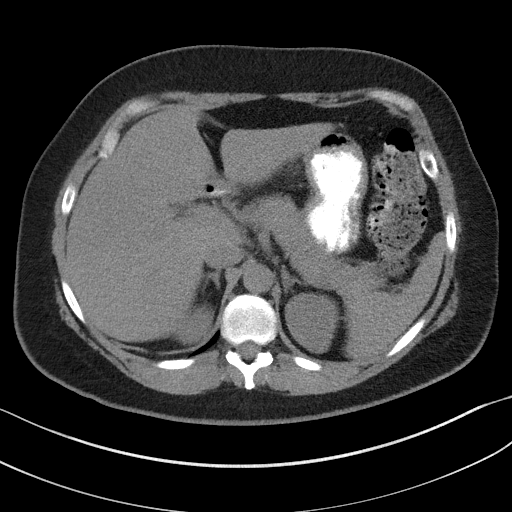
[im 77/94  lung]
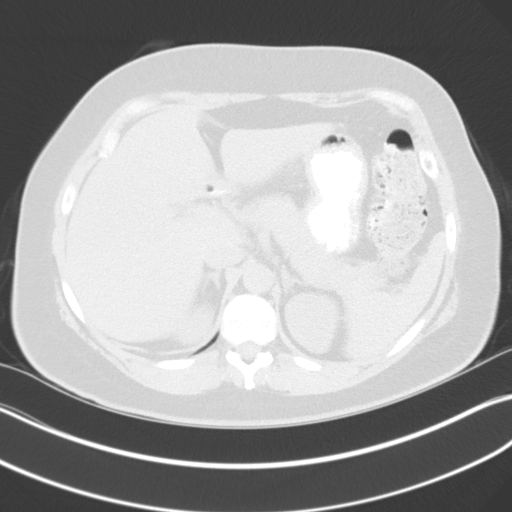
[im 77/94  bone]
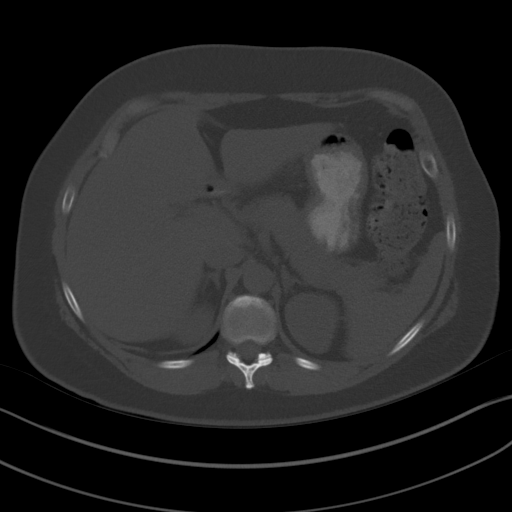
[im 85/94  soft-tissue]
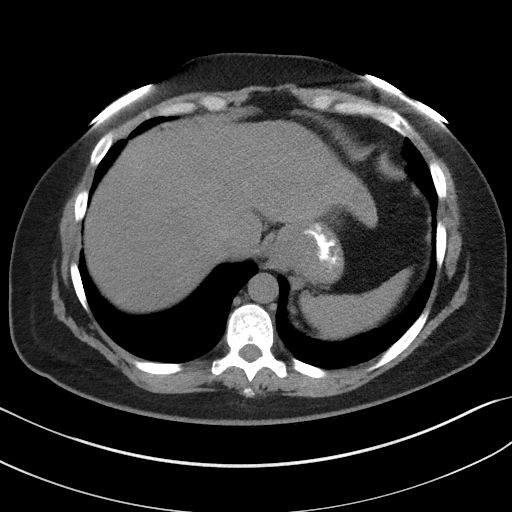
[im 85/94  lung]
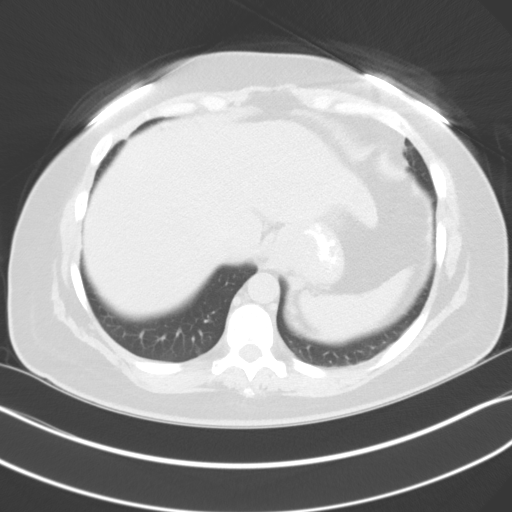

[10 of 32 positions shown; findings below may reference images not displayed]

FINDINGS: Lower chest: Unremarkable.

Hepatobiliary: The liver shows diffusely decreased attenuation
suggesting fat deposition. No suspicious focal abnormality within
the liver parenchyma. There is no evidence for gallstones,
gallbladder wall thickening, or pericholecystic fluid. No
intrahepatic or extrahepatic biliary dilation.

Pancreas: No focal mass lesion. No dilatation of the main duct. No
intraparenchymal cyst. No peripancreatic edema.

Spleen: No splenomegaly. No focal mass lesion.

Adrenals/Urinary Tract: No adrenal nodule or mass.

Precontrast imaging shows no stones in either kidney or ureter. No
bladder stones.

Imaging after IV contrast administration shows no enhancing lesion
in either kidney.

Delayed imaging shows no hydronephrosis in either kidney. No soft
tissue filling defect identified in either intrarenal collecting
system or renal pelvis.

Stomach/Bowel: Tiny hiatal hernia. Stomach otherwise unremarkable.
Duodenum is normally positioned as is the ligament of Treitz. No
small bowel wall thickening. No small bowel dilatation. The terminal
ileum is normal. The appendix is normal. No gross colonic mass. No
colonic wall thickening. Diverticular changes are noted in the left
colon without evidence of diverticulitis.

Vascular/Lymphatic: There is abdominal aortic atherosclerosis
without aneurysm. There is no gastrohepatic or hepatoduodenal
ligament lymphadenopathy. No intraperitoneal or retroperitoneal
lymphadenopathy. No pelvic sidewall lymphadenopathy.

Reproductive: The uterus is surgically absent. There is no adnexal
mass.

Other: No intraperitoneal free fluid.

Musculoskeletal: No worrisome lytic or sclerotic osseous
abnormality.
IMPRESSION: 1. No hydronephrosis in the right kidney as suggested on recent
ultrasound. Unremarkable CT evaluation of the urinary tract.
2. Hepatic steatosis.
3. Tiny hiatal hernia.
4.  Aortic Atherosclerois ([id number]-170.0)

## 2020-08-29 ENCOUNTER — Other Ambulatory Visit: Payer: Self-pay

## 2020-08-29 ENCOUNTER — Encounter: Payer: Self-pay | Admitting: Internal Medicine

## 2020-08-29 ENCOUNTER — Ambulatory Visit: Payer: BC Managed Care – PPO | Admitting: Internal Medicine

## 2020-08-29 VITALS — BP 118/76 | HR 82 | Temp 97.6°F | Ht 66.5 in | Wt 211.2 lb

## 2020-08-29 DIAGNOSIS — M4185 Other forms of scoliosis, thoracolumbar region: Secondary | ICD-10-CM

## 2020-08-29 DIAGNOSIS — E559 Vitamin D deficiency, unspecified: Secondary | ICD-10-CM

## 2020-08-29 DIAGNOSIS — Z1389 Encounter for screening for other disorder: Secondary | ICD-10-CM

## 2020-08-29 DIAGNOSIS — L659 Nonscarring hair loss, unspecified: Secondary | ICD-10-CM

## 2020-08-29 DIAGNOSIS — R5383 Other fatigue: Secondary | ICD-10-CM | POA: Diagnosis not present

## 2020-08-29 DIAGNOSIS — M791 Myalgia, unspecified site: Secondary | ICD-10-CM | POA: Diagnosis not present

## 2020-08-29 DIAGNOSIS — R252 Cramp and spasm: Secondary | ICD-10-CM | POA: Diagnosis not present

## 2020-08-29 DIAGNOSIS — E538 Deficiency of other specified B group vitamins: Secondary | ICD-10-CM

## 2020-08-29 DIAGNOSIS — M549 Dorsalgia, unspecified: Secondary | ICD-10-CM

## 2020-08-29 DIAGNOSIS — G8929 Other chronic pain: Secondary | ICD-10-CM

## 2020-08-29 LAB — CBC WITH DIFFERENTIAL/PLATELET
Basophils Absolute: 0.1 10*3/uL (ref 0.0–0.1)
Basophils Relative: 1 % (ref 0.0–3.0)
Eosinophils Absolute: 0.1 10*3/uL (ref 0.0–0.7)
Eosinophils Relative: 1.2 % (ref 0.0–5.0)
HCT: 40.3 % (ref 36.0–46.0)
Hemoglobin: 13.8 g/dL (ref 12.0–15.0)
Lymphocytes Relative: 38.7 % (ref 12.0–46.0)
Lymphs Abs: 2.8 10*3/uL (ref 0.7–4.0)
MCHC: 34.1 g/dL (ref 30.0–36.0)
MCV: 91.3 fl (ref 78.0–100.0)
Monocytes Absolute: 0.5 10*3/uL (ref 0.1–1.0)
Monocytes Relative: 7.6 % (ref 3.0–12.0)
Neutro Abs: 3.7 10*3/uL (ref 1.4–7.7)
Neutrophils Relative %: 51.5 % (ref 43.0–77.0)
Platelets: 202 10*3/uL (ref 150.0–400.0)
RBC: 4.42 Mil/uL (ref 3.87–5.11)
RDW: 12.8 % (ref 11.5–15.5)
WBC: 7.2 10*3/uL (ref 4.0–10.5)

## 2020-08-29 LAB — COMPREHENSIVE METABOLIC PANEL
ALT: 16 U/L (ref 0–35)
AST: 17 U/L (ref 0–37)
Albumin: 4.3 g/dL (ref 3.5–5.2)
Alkaline Phosphatase: 54 U/L (ref 39–117)
BUN: 18 mg/dL (ref 6–23)
CO2: 25 mEq/L (ref 19–32)
Calcium: 9.2 mg/dL (ref 8.4–10.5)
Chloride: 102 mEq/L (ref 96–112)
Creatinine, Ser: 0.78 mg/dL (ref 0.40–1.20)
GFR: 87.84 mL/min (ref >60.00)
Glucose, Bld: 96 mg/dL (ref 70–99)
Potassium: 3.9 mEq/L (ref 3.5–5.1)
Sodium: 137 mEq/L (ref 135–145)
Total Bilirubin: 0.5 mg/dL (ref 0.2–1.2)
Total Protein: 6.9 g/dL (ref 6.0–8.3)

## 2020-08-29 LAB — CK: Total CK: 50 U/L (ref 7–177)

## 2020-08-29 LAB — VITAMIN D 25 HYDROXY (VIT D DEFICIENCY, FRACTURES): VITD: 27.57 ng/mL — ABNORMAL LOW (ref 30.00–100.00)

## 2020-08-29 LAB — T4, FREE: Free T4: 1.14 ng/dL (ref 0.60–1.60)

## 2020-08-29 LAB — TSH: TSH: 0.81 u[IU]/mL (ref 0.35–4.50)

## 2020-08-29 LAB — MAGNESIUM: Magnesium: 1.7 mg/dL (ref 1.5–2.5)

## 2020-08-29 NOTE — Patient Instructions (Addendum)
Due for Tdap consider getting in the future  Theraworx topical spray for leg cramps    Leg Cramps Leg cramps occur when one or more muscles tighten and you have no control over this tightening (involuntary muscle contraction). Muscle cramps can develop in any muscle, but the most common place is in the calf muscles of the leg. Those cramps can occur during exercise or when you are at rest. Leg cramps are painful, and they may last for a few seconds to a few minutes. Cramps may return several times before they finally stop. Usually, leg cramps are not caused by a serious medical problem. In many cases, the cause is not known. Some common causes include:  Excessive physical effort (overexertion), such as during intense exercise.  Overuse from repetitive motions, or doing the same thing over and over.  Staying in a certain position for a long period of time.  Improper preparation, form, or technique while performing a sport or an activity.  Dehydration.  Injury.  Side effects of certain medicines.  Abnormally low levels of minerals in your blood (electrolytes), especially potassium and calcium. This could result from: ? Pregnancy. ? Taking diuretic medicines. Follow these instructions at home: Eating and drinking  Drink enough fluid to keep your urine pale yellow. Staying hydrated may help prevent cramps.  Eat a healthy diet that includes plenty of nutrients to help your muscles function. A healthy diet includes fruits and vegetables, lean protein, whole grains, and low-fat or nonfat dairy products. Managing pain, stiffness, and swelling      Try massaging, stretching, and relaxing the affected muscle. Do this for several minutes at a time.  If directed, put ice on areas that are sore or painful after a cramp: ? Put ice in a plastic bag. ? Place a towel between your skin and the bag. ? Leave the ice on for 20 minutes, 2-3 times a day.  If directed, apply heat to muscles that  are tense or tight. Do this before you exercise, or as often as told by your health care provider. Use the heat source that your health care provider recommends, such as a moist heat pack or a heating pad. ? Place a towel between your skin and the heat source. ? Leave the heat on for 20-30 minutes. ? Remove the heat if your skin turns bright red. This is especially important if you are unable to feel pain, heat, or cold. You may have a greater risk of getting burned.  Try taking hot showers or baths to help relax tight muscles. General instructions  If you are having frequent leg cramps, avoid intense exercise for several days.  Take over-the-counter and prescription medicines only as told by your health care provider.  Keep all follow-up visits as told by your health care provider. This is important. Contact a health care provider if:  Your leg cramps get more severe or more frequent, or they do not improve over time.  Your foot becomes cold, numb, or blue. Summary  Muscle cramps can develop in any muscle, but the most common place is in the calf muscles of the leg.  Leg cramps are painful, and they may last for a few seconds to a few minutes.  Usually, leg cramps are not caused by a serious medical problem. Often, the cause is not known.  Stay hydrated and take over-the-counter and prescription medicines only as told by your health care provider. This information is not intended to replace advice given to  you by your health care provider. Make sure you discuss any questions you have with your health care provider. Document Revised: 09/19/2017 Document Reviewed: 07/17/2017 Elsevier Patient Education  2020 [employer].    Thoracic Strain Rehab Ask your health care provider which exercises are safe for you. Do exercises exactly as told by your health care provider and adjust them as directed. It is normal to feel mild stretching, pulling, tightness, or discomfort as you do these  exercises. Stop right away if you feel sudden pain or your pain gets worse. Do not begin these exercises until told by your health care provider. Stretching and range-of-motion exercise This exercise warms up your muscles and joints and improves the movement and flexibility of your back and shoulders. This exercise also helps to relieve pain. Chest and spine stretch  1. Lie down on your back on a firm surface. 2. Roll a towel or a small blanket so it is about 4 inches (10 cm) in diameter. 3. Put the towel lengthwise under the middle of your back so it is under your spine, but not under your shoulder blades. 4. Put your hands behind your head and let your elbows fall to your sides. This will increase your stretch. 5. Take a deep breath (inhale). 6. Hold for __________ seconds. 7. Relax after you breathe out (exhale). Repeat __________ times. Complete this exercise __________ times a day. Strengthening exercises These exercises build strength and endurance in your back and your shoulder blade muscles. Endurance is the ability to use your muscles for a long time, even after they get tired. Alternating arm and leg raises  1. Get on your hands and knees on a firm surface. If you are on a hard floor, you may want to use padding, such as an exercise mat, to cushion your knees. 2. Line up your arms and legs. Your hands should be directly below your shoulders, and your knees should be directly below your hips. 3. Lift your left leg behind you. At the same time, raise your right arm and straighten it in front of you. ? Do not lift your leg higher than your hip. ? Do not lift your arm higher than your shoulder. ? Keep your abdominal and back muscles tight. ? Keep your hips facing the ground. ? Do not arch your back. ? Keep your balance carefully, and do not hold your breath. 4. Hold for __________ seconds. 5. Slowly return to the starting position and repeat with your right leg and your left  arm. Repeat __________ times. Complete this exercise __________ times a day. Straight arm rows This exercise is also called shoulder extension exercise. 1. Stand with your feet shoulder width apart. 2. Secure an exercise band to a stable object in front of you so the band is at or above shoulder height. 3. Hold one end of the exercise band in each hand. 4. Straighten your elbows and lift your hands up to shoulder height. 5. Step back, away from the secured end of the exercise band, until the band stretches. 6. Squeeze your shoulder blades together and pull your hands down to the sides of your thighs. Stop when your hands are straight down by your sides. This is shoulder extension. Do not let your hands go behind your body. 7. Hold for __________ seconds. 8. Slowly return to the starting position. Repeat __________ times. Complete this exercise __________ times a day. Prone shoulder external rotation 1. Lie on your abdomen on a firm bed so your  left / right forearm hangs over the edge of the bed and your upper arm is on the bed, straight out from your body. This is the prone position. ? Your elbow should be bent. ? Your palm should be facing your feet. 2. If instructed, hold a __________ weight in your hand. 3. Squeeze your shoulder blade toward the middle of your back. Do not let your shoulder lift toward your ear. 4. Keep your elbow bent in a 90-degree angle (right angle) while you slowly move your forearm up toward the ceiling. Move your forearm up to the height of the bed, toward your head. This is external rotation. ? Your upper arm should not move. ? At the top of the movement, your palm should face the floor. 5. Hold for __________ seconds. 6. Slowly return to the starting position and relax your muscles. Repeat __________ times. Complete this exercise __________ times a day. Rowing scapular retraction This is an exercise in which the shoulder blades (scapulae) are pulled toward each  other (retraction). 1. Sit in a stable chair without armrests, or stand up. 2. Secure an exercise band to a stable object in front of you so the band is at shoulder height. 3. Hold one end of the exercise band in each hand. Your palms should face down. 4. Bring your arms out straight in front of you. 5. Step back, away from the secured end of the exercise band, until the band stretches. 6. Pull the band backward. As you do this, bend your elbows and squeeze your shoulder blades together, but avoid letting the rest of your body move. Do not shrug your shoulders upward while you do this. 7. Stop when your elbows are at your sides or slightly behind your body. 8. Hold for __________ seconds. 9. Slowly straighten your arms to return to the starting position. Repeat __________ times. Complete this exercise __________ times a day. Posture and body mechanics Good posture and healthy body mechanics can help to relieve stress in your body's tissues and joints. Body mechanics refers to the movements and positions of your body while you do your daily activities. Posture is part of body mechanics. Good posture means:  Your spine is in its natural S-curve position (neutral).  Your shoulders are pulled back slightly.  Your head is not tipped forward. Follow these guidelines to improve your posture and body mechanics in your everyday activities. Standing   When standing, keep your spine neutral and your feet about hip width apart. Keep a slight bend in your knees. Your ears, shoulders, and hips should line up with each other.  When you do a task in which you lean forward while standing in one place for a long time, place one foot up on a stable object that is 2-4 inches (5-10 cm) high, such as a footstool. This helps keep your spine neutral. Sitting   When sitting, keep your spine neutral and keep your feet flat on the floor. Use a footrest, if necessary, and keep your thighs parallel to the floor.  Avoid rounding your shoulders, and avoid tilting your head forward.  When working at a desk or a computer, keep your desk at a height where your hands are slightly lower than your elbows. Slide your chair under your desk so you are close enough to maintain good posture.  When working at a computer, place your monitor at a height where you are looking straight ahead and you do not have to tilt your head forward or  downward to look at the screen. Resting When lying down and resting, avoid positions that are most painful for you.  If you have pain with activities such as sitting, bending, stooping, or squatting (flexion-basedactivities), lie in a position in which your body does not bend very much. For example, avoid curling up on your side with your arms and knees near your chest (fetal position).  If you have pain with activities such as standing for a long time or reaching with your arms (extension-basedactivities), lie with your spine in a neutral position and bend your knees slightly. Try the following positions: ? Lie on your side with a pillow between your knees. ? Lie on your back with a pillow under your knees.  Lifting   When lifting objects, keep your feet at least shoulder width apart and tighten your abdominal muscles.  Bend your knees and hips and keep your spine neutral. It is important to lift using the strength of your legs, not your back. Do not lock your knees straight out.  Always ask for help to lift heavy or awkward objects. This information is not intended to replace advice given to you by your health care provider. Make sure you discuss any questions you have with your health care provider. Document Revised: 01/29/2019 Document Reviewed: 11/16/2018 Elsevier Patient Education  [employer].

## 2020-08-29 NOTE — Progress Notes (Signed)
Chief Complaint  Patient presents with  . Muscle Pain   F/u  1. C/o fatigue and body I.e torso feeling shaky at night denies weakness in arms and legs and 08/19/20 x 3 episodes of severe lower ext leg cramps after dancing x 2 hours legs locked and would not unlock tried pickles, mustard 1/4 of husbands muscle relaxer, increased water intake and magnesium calm w/o relief. Area from toes to calves painful and she was crying and could not walk. At times. This cramp severe started behind left knee and lower leg and went to the right   2. C/o hair loss and cold hands and feet   Review of Systems  Constitutional: Negative for weight loss.  HENT: Negative for hearing loss.   Eyes: Negative for blurred vision.  Respiratory: Negative for shortness of breath.   Cardiovascular: Negative for chest pain.  Gastrointestinal: Negative for abdominal pain.  Skin:       +hair loss   Neurological:       +muscle cramps    Past Medical History:  Diagnosis Date  . GERD (gastroesophageal reflux disease)    Past Surgical History:  Procedure Laterality Date  . ABDOMINAL HYSTERECTOMY  2015  . BREAST BIOPSY Right 09/30/2013   benign  . COLONOSCOPY WITH PROPOFOL N/A 07/30/2019   Procedure: COLONOSCOPY WITH PROPOFOL;  Surgeon: Lollie Sails, MD;  Location: [facility] ENDOSCOPY;  Service: Endoscopy;  Laterality: N/A;  . ESOPHAGOGASTRODUODENOSCOPY (EGD) WITH PROPOFOL N/A 07/30/2019   Procedure: ESOPHAGOGASTRODUODENOSCOPY (EGD) WITH PROPOFOL;  Surgeon: Lollie Sails, MD;  Location: [facility] ENDOSCOPY;  Service: Endoscopy;  Laterality: N/A;   Family History  Problem Relation Age of Onset  . Heart attack Father   . Alcohol abuse Father   . Aortic aneurysm Father   . Cancer Maternal Grandmother        ovarian  . Thyroid disease Mother    Social History   Socioeconomic History  . Marital status: Married    Spouse name: Not on file  . Number of children: Not on file  . Years of education: Not on file  .  Highest education level: Not on file  Occupational History  . Not on file  Tobacco Use  . Smoking status: Never Smoker  . Smokeless tobacco: Never Used  Vaping Use  . Vaping Use: Never used  Substance and Sexual Activity  . Alcohol use: No  . Drug use: No  . Sexual activity: Not on file  Other Topics Concern  . Not on file  Social History Narrative  . Not on file   Social Determinants of Health   Financial Resource Strain:   . Difficulty of Paying Living Expenses: Not on file  Food Insecurity:   . Worried About Charity fundraiser in the Last Year: Not on file  . Ran Out of Food in the Last Year: Not on file  Transportation Needs:   . Lack of Transportation (Medical): Not on file  . Lack of Transportation (Non-Medical): Not on file  Physical Activity:   . Days of Exercise per Week: Not on file  . Minutes of Exercise per Session: Not on file  Stress:   . Feeling of Stress : Not on file  Social Connections:   . Frequency of Communication with Friends and Family: Not on file  . Frequency of Social Gatherings with Friends and Family: Not on file  . Attends Religious Services: Not on file  . Active Member of Clubs or Organizations: Not on  file  . Attends Archivist Meetings: Not on file  . Marital Status: Not on file  Intimate Partner Violence:   . Fear of Current or Ex-Partner: Not on file  . Emotionally Abused: Not on file  . Physically Abused: Not on file  . Sexually Abused: Not on file   Current Meds  Medication Sig  . Amino Acids (AMINO ACID PO) Take by mouth.   Allergies  Allergen Reactions  . Cat Hair Extract   . Sudafed [Pseudoephedrine] Anxiety   Recent Results (from the past 2160 hour(s))  SARS CORONAVIRUS 2 (TAT 6-24 HRS) Nasopharyngeal Nasopharyngeal Swab     Status: Abnormal   Collection Time: 06/02/20  1:21 PM   Specimen: Nasopharyngeal Swab  Result Value Ref Range   SARS Coronavirus 2 POSITIVE (A) NEGATIVE    Comment: RESULT CALLED TO,  READ BACK BY AND VERIFIED WITH: EMAILED MELISSA Dunellen 06/03/20 0345 BTAYLOR (NOTE) SARS-CoV-2 target nucleic acids are DETECTED.  The SARS-CoV-2 RNA is generally detectable in upper and lower respiratory specimens during the acute phase of infection. Positive results are indicative of the presence of SARS-CoV-2 RNA. Clinical correlation with patient history and other diagnostic information is  necessary to determine patient infection status. Positive results do not rule out bacterial infection or co-infection with other viruses.  The expected result is Negative.  Fact Sheet for Patients: SugarRoll.be  Fact Sheet for Healthcare Providers: https://www.woods-mathews.com/  This test is not yet approved or cleared by the [place] FDA and  has been authorized for detection and/or diagnosis of SARS-CoV-2 by FDA under an Emergency Use Authorization (EUA). This EUA will remain  in effect (meaning this test ca n be used) for the duration of the COVID-19 declaration under Section 564(b)(1) of the Act, 21 U.S.C. section 360bbb-3(b)(1), unless the authorization is terminated or revoked sooner.   Performed at [facility], [place] [address], [place], [place] [address]    Objective  Body mass index is 33.58 kg/m. Wt Readings from Last 3 Encounters:  08/29/20 211 lb 3.2 oz (95.8 kg)  06/11/20 205 lb (93 kg)  06/02/20 210 lb (95.3 kg)   Temp Readings from Last 3 Encounters:  08/29/20 97.6 F (36.4 C) (Oral)  06/11/20 98.1 F (36.7 C) (Oral)  06/02/20 98.2 F (36.8 C) (Oral)   BP Readings from Last 3 Encounters:  08/29/20 118/76  06/11/20 117/89  06/02/20 131/89   Pulse Readings from Last 3 Encounters:  08/29/20 82  06/11/20 81  06/02/20 75    Physical Exam Vitals and nursing note reviewed.  Constitutional:      Appearance: Normal appearance. She is well-developed and well-groomed. She is obese.  HENT:     Head:  Normocephalic and atraumatic.  Eyes:     Conjunctiva/sclera: Conjunctivae normal.     Pupils: Pupils are equal, round, and reactive to light.  Cardiovascular:     Rate and Rhythm: Normal rate and regular rhythm.     Heart sounds: Normal heart sounds.  Pulmonary:     Effort: Pulmonary effort is normal.     Breath sounds: Normal breath sounds.  Skin:    General: Skin is warm and dry.  Neurological:     General: No focal deficit present.     Mental Status: She is alert and oriented to person, place, and time. Mental status is at baseline.     Gait: Gait normal.  Psychiatric:        Attention and Perception: Attention and perception  normal.        Mood and Affect: Mood and affect normal.        Speech: Speech normal.        Behavior: Behavior normal. Behavior is cooperative.        Thought Content: Thought content normal.        Cognition and Memory: Cognition and memory normal.        Judgment: Judgment normal.     Assessment  Plan  Muscle cramps - Plan: Comprehensive metabolic panel, CBC with Differential/Platelet, CK (Creatine Kinase), Iron, TIBC and Ferritin Panel, TSH, Antinuclear Antib (ANA)  Myalgia - Plan: CK (Creatine Kinase), Antinuclear Antib (ANA)  Fatigue, unspecified type - Plan: Comprehensive metabolic panel, CBC with Differential/Platelet, TSH, Urinalysis, Routine w reflex microscopic, T4, free, Antinuclear Antib (ANA) Had sleep study in the past and negative per pt   Chronic mid back pain Dextroscoliosis of thoracolumbar spine -given back exercises    Hair loss  -w/u with labs and f/u with PCP  Declines flu shot  Consider hep C in future screening   Provider: Dr. Olivia Mackie McLean-Scocuzza-Internal Medicine

## 2020-08-30 LAB — URINALYSIS, ROUTINE W REFLEX MICROSCOPIC
Bilirubin Urine: NEGATIVE
Glucose, UA: NEGATIVE
Hgb urine dipstick: NEGATIVE
Ketones, ur: NEGATIVE
Leukocytes,Ua: NEGATIVE
Nitrite: NEGATIVE
Protein, ur: NEGATIVE
Specific Gravity, Urine: 1.007 (ref 1.001–1.03)
pH: <=5 (ref 5.0–8.0)

## 2020-08-31 LAB — ANTI-NUCLEAR AB-TITER (ANA TITER): ANA Titer 1: 1:80 {titer} — ABNORMAL HIGH

## 2020-08-31 LAB — IRON,TIBC AND FERRITIN PANEL
%SAT: 18 % (calc) (ref 16–45)
Ferritin: 126 ng/mL (ref 16–232)
Iron: 59 ug/dL (ref 45–160)
TIBC: 319 mcg/dL (calc) (ref 250–450)

## 2020-08-31 LAB — ANA: Anti Nuclear Antibody (ANA): POSITIVE — AB

## 2020-09-27 ENCOUNTER — Other Ambulatory Visit: Payer: Self-pay

## 2020-09-27 ENCOUNTER — Ambulatory Visit: Payer: BC Managed Care – PPO | Admitting: Internal Medicine

## 2020-09-27 ENCOUNTER — Encounter: Payer: Self-pay | Admitting: Internal Medicine

## 2020-09-27 VITALS — BP 122/86 | HR 75 | Temp 98.4°F | Resp 15 | Ht 66.5 in | Wt 212.4 lb

## 2020-09-27 DIAGNOSIS — Z2821 Immunization not carried out because of patient refusal: Secondary | ICD-10-CM

## 2020-09-27 DIAGNOSIS — M609 Myositis, unspecified: Secondary | ICD-10-CM | POA: Diagnosis not present

## 2020-09-27 DIAGNOSIS — Z1159 Encounter for screening for other viral diseases: Secondary | ICD-10-CM | POA: Diagnosis not present

## 2020-09-27 DIAGNOSIS — L650 Telogen effluvium: Secondary | ICD-10-CM

## 2020-09-27 DIAGNOSIS — B330 Epidemic myalgia: Secondary | ICD-10-CM

## 2020-09-27 DIAGNOSIS — E039 Hypothyroidism, unspecified: Secondary | ICD-10-CM

## 2020-09-27 DIAGNOSIS — Z5320 Procedure and treatment not carried out because of patient's decision for unspecified reasons: Secondary | ICD-10-CM

## 2020-09-27 DIAGNOSIS — E559 Vitamin D deficiency, unspecified: Secondary | ICD-10-CM

## 2020-09-27 DIAGNOSIS — R768 Other specified abnormal immunological findings in serum: Secondary | ICD-10-CM

## 2020-09-27 DIAGNOSIS — E669 Obesity, unspecified: Secondary | ICD-10-CM

## 2020-09-27 LAB — C-REACTIVE PROTEIN: CRP: <1 mg/dL (ref 0.5–20.0)

## 2020-09-27 LAB — MAGNESIUM: Magnesium: 1.9 mg/dL (ref 1.5–2.5)

## 2020-09-27 LAB — VITAMIN D 25 HYDROXY (VIT D DEFICIENCY, FRACTURES): VITD: 37.64 ng/mL (ref 30.00–100.00)

## 2020-09-27 LAB — SEDIMENTATION RATE: Sed Rate: 14 mm/hr (ref 0–30)

## 2020-09-27 LAB — VITAMIN B12: Vitamin B-12: 259 pg/mL (ref 211–911)

## 2020-09-27 NOTE — Patient Instructions (Addendum)
I AM CHECKING YOUR VITAMIM d LEVEL AGAIN ALONG WITH B12.  I'M ALSO CHECKING  A FEW OTHER TESTS TO RULE  OUT ACTIVE INFLAMMATION  Telogen Effluvium  Telogen effluvium is a condition in which the body sheds much hair. People describe that they are losing hair "from the roots" in excessive amounts. The hair loss is usually 3 to 6 months following an event. The inciting event may be childbirth, a surgery, an illness with a fever, a traumatic psychological event, weight loss, or the start of a new medication. Some people have no known inciting event. For most people, no treatment is necessary, and the hair will stop falling out and begin to regrow with time. Some women develop a chronic form of telogen effluvium in which they continue to lose hair at an accelerated rate.

## 2020-09-27 NOTE — Progress Notes (Signed)
Subjective:  Patient ID: Kristin Davis, female    DOB: 09/16/1971  Age: 49 y.o. MRN: [id number]  CC: The primary encounter diagnosis was Screening for hepatitis C declined. Diagnoses of Encounter for hepatitis C screening test for low risk patient, Myositis of multiple sites, unspecified myositis type, Vitamin D deficiency, COVID-19 vaccination declined, Hypomagnesemia, Myalgia, epidemic, Positive ANA (antinuclear antibody), Telogen effluvium, Obesity (BMI 30-39.9), and Hypothyroidism, unspecified type were also pertinent to this visit.  HPI Kristin Davis presents for 1 month follow up on myalgias  That occurred in the setting of recent strenuous activity (a night of dancing and probable dehydration)  This visit occurred during the SARS-CoV-2 public health emergency.  Safety protocols were in place, including screening questions prior to the visit, additional usage of staff PPE, and extensive cleaning of exam room while observing appropriate contact time as indicated for disinfecting solutions.  Patient was seen one month ago and requested workup for auotimmune syndromes due to severe myalgias that occurred after a particularly busy night of dancing .  Has not had any recent /subsequent episodes.  Has begun working out 2-3 times per week. Drinking  Body Armour ,  takinmg a magnesium glycinate supplement 250 mg daily.  Also taking 5000 ius of D3 and a MVI   Initial weight  225 lbs Jan 2021  Now 206 to 208 lbs,  .  Eating 5 smaller meals. Macros diet and using an app on her phone that is recommended a daily intake of  135 gm protein , 35 to 100 carbs.  1350 cal per day  Concerned about her hair loss.  Has noticed more Shedding since COVID INFECTION in august .   Reviewed recent labs.    Outpatient Medications Prior to Visit  Medication Sig Dispense Refill  . cholecalciferol (VITAMIN D3) 25 MCG (1000 UNIT) tablet Take 1,000 Units by mouth daily.    Marland Kitchen MAGNESIUM GLUCONATE PO Take 1 tablet by  mouth daily.    . Multiple Vitamin (MULTIVITAMIN) tablet Take 1 tablet by mouth daily.    . Amino Acids (AMINO ACID PO) Take by mouth. (Patient not taking: Reported on 09/27/2020)     No facility-administered medications prior to visit.    Review of Systems;  Patient denies headache, fevers, malaise, unintentional weight loss, skin rash, eye pain, sinus congestion and sinus pain, sore throat, dysphagia,  hemoptysis , cough, dyspnea, wheezing, chest pain, palpitations, orthopnea, edema, abdominal pain, nausea, melena, diarrhea, constipation, flank pain, dysuria, hematuria, urinary  Frequency, nocturia, numbness, tingling, seizures,  Focal weakness, Loss of consciousness,  Tremor, insomnia, depression, anxiety, and suicidal ideation.      Objective:  BP 122/86 (BP Location: Left Arm, Patient Position: Sitting, Cuff Size: Large)   Pulse 75   Temp 98.4 F (36.9 C) (Oral)   Resp 15   Ht 5' 6.5" (1.689 m)   Wt 212 lb 6.4 oz (96.3 kg)   LMP 10/28/2012   SpO2 99%   BMI 33.77 kg/m   BP Readings from Last 3 Encounters:  09/27/20 122/86  08/29/20 118/76  06/11/20 117/89    Wt Readings from Last 3 Encounters:  09/27/20 212 lb 6.4 oz (96.3 kg)  08/29/20 211 lb 3.2 oz (95.8 kg)  06/11/20 205 lb (93 kg)    General appearance: alert, cooperative and appears stated age Ears: normal TM's and external ear canals both ears Throat: lips, mucosa, and tongue normal; teeth and gums normal Neck: no adenopathy, no carotid bruit, supple, symmetrical, trachea  midline and thyroid not enlarged, symmetric, no tenderness/mass/nodules Back: symmetric, no curvature. ROM normal. No CVA tenderness. Lungs: clear to auscultation bilaterally Heart: regular rate and rhythm, S1, S2 normal, no murmur, click, rub or gallop Abdomen: soft, non-tender; bowel sounds normal; no masses,  no organomegaly Pulses: 2+ and symmetric Skin: Skin color, texture, turgor normal. No rashes or lesions Lymph nodes: Cervical,  supraclavicular, and axillary nodes normal.  Lab Results  Component Value Date   HGBA1C 5.5 06/25/2019   HGBA1C 5.3 05/28/2019    Lab Results  Component Value Date   CREATININE 0.78 08/29/2020   CREATININE 0.83 06/25/2019   CREATININE 0.89 05/28/2019    Lab Results  Component Value Date   WBC 7.2 08/29/2020   HGB 13.8 08/29/2020   HCT 40.3 08/29/2020   PLT 202.0 08/29/2020   GLUCOSE 96 08/29/2020   CHOL 201 (H) 06/25/2019   TRIG 76.0 06/25/2019   HDL 44.20 06/25/2019   LDLDIRECT 141.8 11/12/2012   LDLCALC 142 (H) 06/25/2019   ALT 16 08/29/2020   AST 17 08/29/2020   NA 137 08/29/2020   K 3.9 08/29/2020   CL 102 08/29/2020   CREATININE 0.78 08/29/2020   BUN 18 08/29/2020   CO2 25 08/29/2020   TSH 0.81 08/29/2020   HGBA1C 5.5 06/25/2019    DG Chest 2 View  Result Date: 06/11/2020 CLINICAL DATA:  Dyspnea, cough, COVID-19 positive EXAM: CHEST - 2 VIEW COMPARISON:  None. FINDINGS: Normal heart size. Normal mediastinal contour. No pneumothorax. No pleural effusion. Mild patchy opacities in the peripheral lungs bilaterally, left greater than right. IMPRESSION: Mild patchy peripheral lung opacities bilaterally, left greater than right, compatible with COVID-19 pneumonia. Electronically Signed   By: Ilona Sorrel M.D.   On: 06/11/2020 16:51    Assessment & Plan:   Problem List Items Addressed This Visit      Unprioritized   Obesity (BMI 30-39.9)    I have congratulated her in reduction of   BMI and encouraged  Continued weight loss with goal of 10% of body weigh over the next 6 months using a low glycemic index diet and regular exercise a minimum of 5 days per week.        Myositis of multiple sites    Patient was seen one month ago after having an episode of several myalgias.  She was screened for autoimmune syndromes and inflammatory states.. ESR and CK were normal but her ANA titer was 1:80 which is most likely irrelevant , but because of her mother's history she is  requesting rheumatology evaluation /referral.        Relevant Orders   Vitamin B12 (Completed)   Sedimentation rate (Completed)   C-reactive protein (Completed)   Vitamin D deficiency    Vitamin D has normalized on current supplementation.  No changes today      Relevant Orders   VITAMIN D 25 Hydroxy (Vit-D Deficiency, Fractures) (Completed)   Telogen effluvium    Appears to be secondary to August 2021 COVID 19 infection.        RESOLVED: Hypothyroidism    Repeat assessment of thyroid function with TSH and T4 is normal  Lab Results  Component Value Date   TSH 0.81 08/29/2020          Other Visit Diagnoses    Screening for hepatitis C declined    -  Primary   Relevant Orders   Hepatitis C antibody (Completed)   Encounter for hepatitis C screening test for low risk patient  COVID-19 vaccination declined       Relevant Orders   SARS-CoV-2 Semi-Quantitative Total Antibody, Spike   Hypomagnesemia       Relevant Orders   Magnesium (Completed)   Myalgia, epidemic       Relevant Orders   Ambulatory referral to Rheumatology   Positive ANA (antinuclear antibody)       Relevant Orders   Ambulatory referral to Rheumatology     I provided  30 minutes of  face-to-face time during this encounter reviewing patient's current problems and past surgeries, labs and imaging studies, providing counseling on the above mentioned problems , and coordination  of care .  I have discontinued Cindra A. Wysong's Amino Acids (AMINO ACID PO). I am also having her maintain her MAGNESIUM GLUCONATE PO, cholecalciferol, and multivitamin.  No orders of the defined types were placed in this encounter.   Medications Discontinued During This Encounter  Medication Reason  . Amino Acids (AMINO ACID PO)     Follow-up: Return in about 6 months (around 03/28/2021).   Crecencio Mc, MD

## 2020-09-28 LAB — HEPATITIS C ANTIBODY
Hepatitis C Ab: NONREACTIVE
SIGNAL TO CUT-OFF: 0.02 (ref <1.00)

## 2020-09-30 ENCOUNTER — Encounter: Payer: Self-pay | Admitting: Internal Medicine

## 2020-09-30 DIAGNOSIS — L650 Telogen effluvium: Secondary | ICD-10-CM | POA: Insufficient documentation

## 2020-09-30 NOTE — Assessment & Plan Note (Signed)
Repeat assessment of thyroid function with TSH and T4 is normal  Lab Results  Component Value Date   TSH 0.81 08/29/2020

## 2020-09-30 NOTE — Assessment & Plan Note (Signed)
Patient was seen one month ago after having an episode of several myalgias.  She was screened for autoimmune syndromes and inflammatory states.. ESR and CK were normal but her ANA titer was 1:80 which is most likely irrelevant , but because of her mother's history she is requesting rheumatology evaluation /referral.

## 2020-09-30 NOTE — Assessment & Plan Note (Signed)
I have congratulated her in reduction of   BMI and encouraged  Continued weight loss with goal of 10% of body weigh over the next 6 months using a low glycemic index diet and regular exercise a minimum of 5 days per week.    

## 2020-09-30 NOTE — Assessment & Plan Note (Signed)
Vitamin D has normalized on current supplementation.  No changes today

## 2020-09-30 NOTE — Assessment & Plan Note (Signed)
Appears to be secondary to August 2021 COVID 19 infection.

## 2020-10-01 LAB — SARS-COV-2 SEMI-QUANTITATIVE TOTAL ANTIBODY, SPIKE: SARS COV2 AB, Total Spike Semi QN: >2500 U/mL — ABNORMAL HIGH (ref <0.8)

## 2020-10-04 NOTE — Progress Notes (Signed)
You have a very robust COVID antibody titre.  Your b12 is < 300 which I consider low and treatable. Please start taking an  oral supplement  1000 mcg daily and plan to recheck it in 3 months   The rest of your labs are normal.  .  I believe it is unlikely that you have a rheumatologic disease, but  I see that you have a rheumatology appointment scheduled for jan 6 , so please let the doctor know about the labs that we did    Regards,   Deborra Medina, MD

## 2021-03-28 ENCOUNTER — Ambulatory Visit: Payer: BC Managed Care – PPO | Admitting: Internal Medicine

## 2021-03-28 ENCOUNTER — Encounter: Payer: Self-pay | Admitting: Internal Medicine

## 2021-03-28 ENCOUNTER — Other Ambulatory Visit: Payer: Self-pay

## 2021-03-28 VITALS — BP 110/76 | HR 71 | Temp 96.7°F | Resp 15 | Ht 66.5 in | Wt 216.2 lb

## 2021-03-28 DIAGNOSIS — K760 Fatty (change of) liver, not elsewhere classified: Secondary | ICD-10-CM

## 2021-03-28 DIAGNOSIS — M609 Myositis, unspecified: Secondary | ICD-10-CM | POA: Diagnosis not present

## 2021-03-28 DIAGNOSIS — E559 Vitamin D deficiency, unspecified: Secondary | ICD-10-CM

## 2021-03-28 DIAGNOSIS — E538 Deficiency of other specified B group vitamins: Secondary | ICD-10-CM | POA: Diagnosis not present

## 2021-03-28 DIAGNOSIS — E669 Obesity, unspecified: Secondary | ICD-10-CM

## 2021-03-28 LAB — SEDIMENTATION RATE: Sed Rate: 16 mm/hr (ref 0–30)

## 2021-03-28 LAB — COMPREHENSIVE METABOLIC PANEL
ALT: 14 U/L (ref 0–35)
AST: 16 U/L (ref 0–37)
Albumin: 4.5 g/dL (ref 3.5–5.2)
Alkaline Phosphatase: 61 U/L (ref 39–117)
BUN: 22 mg/dL (ref 6–23)
CO2: 25 mEq/L (ref 19–32)
Calcium: 9.4 mg/dL (ref 8.4–10.5)
Chloride: 103 mEq/L (ref 96–112)
Creatinine, Ser: 0.79 mg/dL (ref 0.40–1.20)
GFR: 86.16 mL/min (ref >60.00)
Glucose, Bld: 86 mg/dL (ref 70–99)
Potassium: 4 mEq/L (ref 3.5–5.1)
Sodium: 137 mEq/L (ref 135–145)
Total Bilirubin: 0.6 mg/dL (ref 0.2–1.2)
Total Protein: 7.4 g/dL (ref 6.0–8.3)

## 2021-03-28 LAB — LIPID PANEL
Cholesterol: 235 mg/dL — ABNORMAL HIGH (ref 0–200)
HDL: 53 mg/dL (ref >39.00)
LDL Cholesterol: 159 mg/dL — ABNORMAL HIGH (ref 0–99)
NonHDL: 181.76
Total CHOL/HDL Ratio: 4
Triglycerides: 112 mg/dL (ref 0.0–149.0)
VLDL: 22.4 mg/dL (ref 0.0–40.0)

## 2021-03-28 LAB — VITAMIN B12: Vitamin B-12: 274 pg/mL (ref 211–911)

## 2021-03-28 LAB — VITAMIN D 25 HYDROXY (VIT D DEFICIENCY, FRACTURES): VITD: 32.58 ng/mL (ref 30.00–100.00)

## 2021-03-28 NOTE — Patient Instructions (Addendum)
For your non Optavia meals,  Try the Healthy Choice low carb power bowl entrees and their "steamer " entrees.  They are high protein, low carb and 200 calories   You need to walk for 30 minutes daily on the days you don't belly dance

## 2021-03-28 NOTE — Progress Notes (Signed)
Subjective:  Patient ID: Kristin Davis, female    DOB: 1970/12/17  Age: 50 y.o. MRN: [id number]  CC: The primary encounter diagnosis was B12 deficiency. Diagnoses of Hepatic steatosis, Vitamin D deficiency, Myositis of multiple sites, unspecified myositis type, and Obesity (BMI 30-39.9) were also pertinent to this visit.  HPI ELLAROSE BRANDI presents for  Follow up on obesity,  Joint pain with negative rheumatoid screen and b12 deficiency  This visit occurred during the SARS-CoV-2 public health emergency.  Safety protocols were in place, including screening questions prior to the visit, additional usage of staff PPE, and extensive cleaning of exam room while observing appropriate contact time as indicated for disinfecting solutions.   Joint pain:  seen by TMS  serologies noted a positive ANA speckled titer 1:80  Did not have rheum eval due to recurrent infection wiith COVID . Currently has no joint symptoms since resuming Optavia diet  Wondering if she needs to see rheumatology.  Advised that unless ESR is elevated it is unlikely to be suggestive of a rheumatologic disorder    Obesity:  Repeat trial of Optavia .  Fluid retention resolved, but not losing weight despite using it for the past 2 months.  Not sticking to it. Doing a shake or waffle cake for breakfast,  A snack at 10:00,   Salad for lunch,   Snack in the afternoon,  And her own dinner.  Not measuring caloric intake. Not exercisig.   Taking vitamin 4000 Ius daily for 2 weeks,  Less before that   Mood:  symptoms worse in the winter , better now.     Outpatient Medications Prior to Visit  Medication Sig Dispense Refill   cholecalciferol (VITAMIN D3) 25 MCG (1000 UNIT) tablet Take 1,000 Units by mouth daily.     MAGNESIUM GLUCONATE PO Take 1 tablet by mouth daily.     Multiple Vitamin (MULTIVITAMIN) tablet Take 1 tablet by mouth daily.     No facility-administered medications prior to visit.    Review of Systems;  Patient  denies headache, fevers, malaise, unintentional weight loss, skin rash, eye pain, sinus congestion and sinus pain, sore throat, dysphagia,  hemoptysis , cough, dyspnea, wheezing, chest pain, palpitations, orthopnea, edema, abdominal pain, nausea, melena, diarrhea, constipation, flank pain, dysuria, hematuria, urinary  Frequency, nocturia, numbness, tingling, seizures,  Focal weakness, Loss of consciousness,  Tremor, insomnia, depression, anxiety, and suicidal ideation.      Objective:  BP 110/76 (BP Location: Left Arm, Patient Position: Sitting, Cuff Size: Large)   Pulse 71   Temp (!) 96.7 F (35.9 C) (Temporal)   Resp 15   Ht 5' 6.5" (1.689 m)   Wt 216 lb 3.2 oz (98.1 kg)   LMP 10/28/2012   SpO2 97%   BMI 34.37 kg/m   BP Readings from Last 3 Encounters:  03/28/21 110/76  09/27/20 122/86  08/29/20 118/76    Wt Readings from Last 3 Encounters:  03/28/21 216 lb 3.2 oz (98.1 kg)  09/27/20 212 lb 6.4 oz (96.3 kg)  08/29/20 211 lb 3.2 oz (95.8 kg)    General appearance: alert, cooperative and appears stated age Ears: normal TM's and external ear canals both ears Throat: lips, mucosa, and tongue normal; teeth and gums normal Neck: no adenopathy, no carotid bruit, supple, symmetrical, trachea midline and thyroid not enlarged, symmetric, no tenderness/mass/nodules Back: symmetric, no curvature. ROM normal. No CVA tenderness. Lungs: clear to auscultation bilaterally Heart: regular rate and rhythm, S1, S2 normal, no murmur, click,  rub or gallop Abdomen: soft, non-tender; bowel sounds normal; no masses,  no organomegaly Pulses: 2+ and symmetric Skin: Skin color, texture, turgor normal. No rashes or lesions Lymph nodes: Cervical, supraclavicular, and axillary nodes normal.  Lab Results  Component Value Date   HGBA1C 5.5 06/25/2019   HGBA1C 5.3 05/28/2019    Lab Results  Component Value Date   CREATININE 0.79 03/28/2021   CREATININE 0.78 08/29/2020   CREATININE 0.83 06/25/2019     Lab Results  Component Value Date   WBC 7.2 08/29/2020   HGB 13.8 08/29/2020   HCT 40.3 08/29/2020   PLT 202.0 08/29/2020   GLUCOSE 86 03/28/2021   CHOL 235 (H) 03/28/2021   TRIG 112.0 03/28/2021   HDL 53.00 03/28/2021   LDLDIRECT 141.8 11/12/2012   LDLCALC 159 (H) 03/28/2021   ALT 14 03/28/2021   AST 16 03/28/2021   NA 137 03/28/2021   K 4.0 03/28/2021   CL 103 03/28/2021   CREATININE 0.79 03/28/2021   BUN 22 03/28/2021   CO2 25 03/28/2021   TSH 0.81 08/29/2020   HGBA1C 5.5 06/25/2019    DG Chest 2 View  Result Date: 06/11/2020 CLINICAL DATA:  Dyspnea, cough, COVID-19 positive EXAM: CHEST - 2 VIEW COMPARISON:  None. FINDINGS: Normal heart size. Normal mediastinal contour. No pneumothorax. No pleural effusion. Mild patchy opacities in the peripheral lungs bilaterally, left greater than right. IMPRESSION: Mild patchy peripheral lung opacities bilaterally, left greater than right, compatible with COVID-19 pneumonia. Electronically Signed   By: Ilona Sorrel M.D.   On: 06/11/2020 16:51    Assessment & Plan:   Problem List Items Addressed This Visit       Unprioritized   Obesity (BMI 30-39.9)    Agree with Optavia diet,  Lack of weight loss likely due to lack of adherence and lack of physical activity.        Hepatic steatosis   Relevant Orders   Comprehensive metabolic panel (Completed)   Lipid panel (Completed)   Myositis of multiple sites    Patient has been seen mulitple times for for muscle pai.  She has been  screened for autoimmune syndromes and inflammatory states..She has no synovitis on exam.  ESR and CK  Have been  repeately normal but her ANA titer was 1:80  With a speckled pattern.  No rheumatology evaluation needed.   Lab Results  Component Value Date   ESRSEDRATE 16 03/28/2021   Lab Results  Component Value Date   CRP <1.0 09/27/2020          Relevant Orders   Sedimentation rate (Completed)   Vitamin D deficiency   Relevant Orders    VITAMIN D 25 Hydroxy (Vit-D Deficiency, Fractures) (Completed)   Other Visit Diagnoses     B12 deficiency    -  Primary   Relevant Orders   Vitamin B12 (Completed)       I provided  30 minutes of  face-to-face time during this encounter reviewing patient's current problems and past surgeries, labs and imaging studies, providing counseling on the above mentioned problems , and coordination  of care .   I am having Kemesha A. Wendel maintain her MAGNESIUM GLUCONATE PO, cholecalciferol, and multivitamin.  No orders of the defined types were placed in this encounter.   There are no discontinued medications.  Follow-up: No follow-ups on file.   Crecencio Mc, MD

## 2021-03-31 NOTE — Assessment & Plan Note (Addendum)
Patient has been seen mulitple times for for muscle pai.  She has been  screened for autoimmune syndromes and inflammatory states..She has no synovitis on exam.  ESR and CK  Have been  repeately normal but her ANA titer was 1:80  With a speckled pattern.  No rheumatology evaluation needed.   Lab Results  Component Value Date   ESRSEDRATE 16 03/28/2021   Lab Results  Component Value Date   CRP <1.0 09/27/2020

## 2021-03-31 NOTE — Assessment & Plan Note (Signed)
Agree with Optavia diet,  Lack of weight loss likely due to lack of adherence and lack of physical activity.

## 2021-11-15 ENCOUNTER — Other Ambulatory Visit: Payer: Self-pay

## 2021-11-15 ENCOUNTER — Ambulatory Visit
Admission: EM | Admit: 2021-11-15 | Discharge: 2021-11-15 | Disposition: A | Discharge status: Home / Self Care | Payer: BC Managed Care – PPO | Attending: Emergency Medicine | Admitting: Emergency Medicine

## 2021-11-15 ENCOUNTER — Ambulatory Visit: Admit: 2021-11-15 | Payer: BC Managed Care – PPO

## 2021-11-15 DIAGNOSIS — B9689 Other specified bacterial agents as the cause of diseases classified elsewhere: Secondary | ICD-10-CM | POA: Diagnosis not present

## 2021-11-15 DIAGNOSIS — N760 Acute vaginitis: Secondary | ICD-10-CM | POA: Insufficient documentation

## 2021-11-15 DIAGNOSIS — B3731 Acute candidiasis of vulva and vagina: Secondary | ICD-10-CM | POA: Diagnosis not present

## 2021-11-15 LAB — WET PREP, GENITAL
Sperm: NONE SEEN
Trich, Wet Prep: NONE SEEN
WBC, Wet Prep HPF POC: <10 (ref <10)

## 2021-11-15 LAB — URINALYSIS, COMPLETE (UACMP) WITH MICROSCOPIC
Bilirubin Urine: NEGATIVE
Glucose, UA: NEGATIVE mg/dL
Ketones, ur: NEGATIVE mg/dL
Nitrite: NEGATIVE
Protein, ur: 30 mg/dL — AB
Specific Gravity, Urine: <1.005 — ABNORMAL LOW (ref 1.005–1.030)
WBC, UA: >50 WBC/hpf (ref 0–5)
pH: 6 (ref 5.0–8.0)

## 2021-11-15 MED ORDER — FLUCONAZOLE 150 MG PO TABS: 150.0000 mg | ORAL_TABLET | Freq: Every day | ORAL | 0 refills | Status: AC

## 2021-11-15 MED ORDER — PHENAZOPYRIDINE HCL 200 MG PO TABS: 200.0000 mg | ORAL_TABLET | Freq: Three times a day (TID) | ORAL | 0 refills | Status: DC

## 2021-11-15 MED ORDER — METRONIDAZOLE 500 MG PO TABS: 500.0000 mg | ORAL_TABLET | Freq: Two times a day (BID) | ORAL | 0 refills | Status: DC

## 2021-11-15 NOTE — ED Provider Notes (Signed)
[facility]    CSN: [id number] Arrival date & time: 11/15/21  0836      History   Chief Complaint Chief Complaint  Patient presents with   Abdominal Pain   Urinary Frequency    HPI BARB SHEAR is a 51 y.o. female.   Patient presents with dysuria, frequency, urgency, lower abdominal pressure and hematuria for 1 day.  Endorses that symptoms worsened this morning.  Last week she had an upper respiratory infection with associated coughing that caused urinary incontinence which she associates as the reason for current symptoms.  Has not attempted treatment.  Denies vaginal discharge, itching, irritation, flank pain, fever, chills.  Past Medical History:  Diagnosis Date   GERD (gastroesophageal reflux disease)     Patient Active Problem List   Diagnosis Date Noted   Telogen effluvium 09/30/2020   Dextroscoliosis of thoracolumbar spine 08/29/2020   Vitamin D deficiency 08/29/2020   Myositis of multiple sites 08/15/2019   Food intolerance in adult 08/15/2019   S/P hysterectomy 06/09/2019   Hepatic steatosis 06/09/2019   Alternative medicine 06/09/2019   Generalized anxiety disorder 06/11/2016   Right-sided thoracic back pain 04/14/2014   Primary snoring 04/14/2014   Obesity (BMI 30-39.9) 11/13/2012    Past Surgical History:  Procedure Laterality Date   ABDOMINAL HYSTERECTOMY  2015   BREAST BIOPSY Right 09/30/2013   benign   COLONOSCOPY WITH PROPOFOL N/A 07/30/2019   Procedure: COLONOSCOPY WITH PROPOFOL;  Surgeon: Lollie Sails, MD;  Location: [facility] ENDOSCOPY;  Service: Endoscopy;  Laterality: N/A;   ESOPHAGOGASTRODUODENOSCOPY (EGD) WITH PROPOFOL N/A 07/30/2019   Procedure: ESOPHAGOGASTRODUODENOSCOPY (EGD) WITH PROPOFOL;  Surgeon: Lollie Sails, MD;  Location: [facility] ENDOSCOPY;  Service: Endoscopy;  Laterality: N/A;    OB History   No obstetric history on file.      Home Medications    Prior to Admission medications   Medication Sig Start  Date End Date Taking? Authorizing Provider  cholecalciferol (VITAMIN D3) 25 MCG (1000 UNIT) tablet Take 1,000 Units by mouth daily.   Yes [provider]  MAGNESIUM GLUCONATE PO Take 1 tablet by mouth daily.   Yes [provider]  Multiple Vitamin (MULTIVITAMIN) tablet Take 1 tablet by mouth daily.   Yes [provider]  RABEprazole (ACIPHEX) 20 MG tablet TAKE 1 TABLET (20 MG TOTAL) BY MOUTH ONCE DAILY TAKE 30 MINUTES BEFORE BREAKFAST 07/30/19 09/28/19  [provider]    Family History Family History  Problem Relation Age of Onset   Heart attack Father    Alcohol abuse Father    Aortic aneurysm Father    Cancer Maternal Grandmother        ovarian   Thyroid disease Mother     Social History Social History   Tobacco Use   Smoking status: Never   Smokeless tobacco: Never  Vaping Use   Vaping Use: Never used  Substance Use Topics   Alcohol use: No   Drug use: No     Allergies   Cat hair extract and Sudafed [pseudoephedrine]   Review of Systems Review of Systems  Constitutional: Negative.   Gastrointestinal:  Positive for abdominal pain. Negative for abdominal distention, anal bleeding, blood in stool, constipation, diarrhea, nausea, rectal pain and vomiting.  Genitourinary:  Positive for dysuria, frequency, hematuria and urgency. Negative for decreased urine volume, difficulty urinating, dyspareunia, enuresis, flank pain, genital sores, menstrual problem, pelvic pain, vaginal bleeding, vaginal discharge and vaginal pain.  Skin: Negative.     Physical Exam  Triage Vital Signs ED Triage Vitals  Enc Vitals Group     BP 11/15/21 0944 (!) 140/93     Pulse Rate 11/15/21 0944 70     Resp 11/15/21 0944 18     Temp 11/15/21 0944 98.4 F (36.9 C)     Temp Source 11/15/21 0944 Oral     SpO2 11/15/21 0944 100 %     Weight 11/15/21 0942 220 lb (99.8 kg)     Height 11/15/21 0942 5\' 6"  (1.676 m)     Head Circumference --      Peak Flow --       Pain Score 11/15/21 0942 8     Pain Loc --      Pain Edu? --      Excl. in [place]? --    No data found.  Updated Vital Signs BP (!) 140/93 (BP Location: Left Arm)    Pulse 70    Temp 98.4 F (36.9 C) (Oral)    Resp 18    Ht 5\' 6"  (1.676 m)    Wt 220 lb (99.8 kg)    LMP 10/28/2012    SpO2 100%    BMI 35.51 kg/m   Visual Acuity Right Eye Distance:   Left Eye Distance:   Bilateral Distance:    Right Eye Near:   Left Eye Near:    Bilateral Near:     Physical Exam Constitutional:      Appearance: She is well-developed.  HENT:     Head: Normocephalic.  Pulmonary:     Effort: Pulmonary effort is normal.  Abdominal:     General: Abdomen is flat. Bowel sounds are normal.     Palpations: Abdomen is soft.     Tenderness: There is no abdominal tenderness. There is no right CVA tenderness or left CVA tenderness.  Genitourinary:    Comments: Deferred Skin:    General: Skin is warm and dry.  Neurological:     General: No focal deficit present.     Mental Status: She is alert and oriented to person, place, and time.  Psychiatric:        Mood and Affect: Mood normal.        Behavior: Behavior normal.     UC Treatments / Results  Labs (all labs ordered are listed, but only abnormal results are displayed) Labs Reviewed  URINALYSIS, COMPLETE (UACMP) WITH MICROSCOPIC    EKG   Radiology No results found.  Procedures Procedures (including critical care time)  Medications Ordered in UC Medications - No data to display  Initial Impression / Assessment and Plan / UC Course  I have reviewed the triage vital signs and the nursing notes.  Pertinent labs & imaging results that were available during my care of the patient were reviewed by me and considered in my medical decision making (see chart for details).  Bacterial vaginosis Vaginal yeast infection  Urinalysis positive for bacteria and Michelangelo Rindfleisch blood cells but negative for nitrates, wet prep positive for yeast, BV, negative  for trichomoniasis, discussed findings with patient, metronidazole 7-day course prescribed, recommended discontinuation of alcohol use while on medication, Diflucan prescribed for treatment of yeast and Pyridium prescribed for pain, recommended feminine hygiene such as wiping from front to back, urination after sexual encounters and using unscented products, work note given, urgent care follow-up as needed Final Clinical Impressions(s) / UC Diagnoses   Final diagnoses:  None   Discharge Instructions   None    ED Prescriptions   None  PDMP not reviewed this encounter.   Hans Eden, NP 11/15/21 1055

## 2021-11-15 NOTE — ED Triage Notes (Signed)
Pt had a cold last week and had to wear a pad as every time she coughed or sneezed, she would urinate.   Pt c/o urinary burning, frequency, abdominal pain. Pt asks if her ears can be checked for an ear infection. Pt states her ears are clogged from her col.

## 2021-11-15 NOTE — Discharge Instructions (Addendum)
Urinalysis, negative for infection, vaginal swab positive for yeast, bacterial vaginosis, negative for trichomoniasis  For the bacterial vaginosis, take metronidazole twice a day for the next 7 days, please do not drink any alcohol as this will make you feel sick  For the yeast, take 1 Diflucan pill today then take second dose the day after you complete the antibiotic  You may use Pyridium up to 3 times a day for the next 2 days, if this medication is not covered by insurance you may purchase over-the-counter Azo which ever medication is cheaper  As always continue to practice good feminine hygiene, wiping from front to back, using unscented products and urinating after all sexual encounters

## 2021-11-20 ENCOUNTER — Encounter: Payer: Self-pay | Admitting: Adult Health

## 2021-11-20 ENCOUNTER — Ambulatory Visit (INDEPENDENT_AMBULATORY_CARE_PROVIDER_SITE_OTHER): Payer: BC Managed Care – PPO | Admitting: Adult Health

## 2021-11-20 ENCOUNTER — Other Ambulatory Visit: Payer: Self-pay

## 2021-11-20 VITALS — BP 118/70 | HR 88 | Temp 98.9°F | Resp 14 | Ht 66.0 in | Wt 226.2 lb

## 2021-11-20 DIAGNOSIS — Z90710 Acquired absence of both cervix and uterus: Secondary | ICD-10-CM

## 2021-11-20 DIAGNOSIS — N760 Acute vaginitis: Secondary | ICD-10-CM | POA: Insufficient documentation

## 2021-11-20 DIAGNOSIS — R399 Unspecified symptoms and signs involving the genitourinary system: Secondary | ICD-10-CM

## 2021-11-20 DIAGNOSIS — R635 Abnormal weight gain: Secondary | ICD-10-CM

## 2021-11-20 DIAGNOSIS — B379 Candidiasis, unspecified: Secondary | ICD-10-CM

## 2021-11-20 DIAGNOSIS — B9689 Other specified bacterial agents as the cause of diseases classified elsewhere: Secondary | ICD-10-CM

## 2021-11-20 LAB — CBC WITH DIFFERENTIAL/PLATELET
Basophils Absolute: 0.1 10*3/uL (ref 0.0–0.1)
Basophils Relative: 0.5 % (ref 0.0–3.0)
Eosinophils Absolute: 0.1 10*3/uL (ref 0.0–0.7)
Eosinophils Relative: 0.6 % (ref 0.0–5.0)
HCT: 41.9 % (ref 36.0–46.0)
Hemoglobin: 13.8 g/dL (ref 12.0–15.0)
Lymphocytes Relative: 22 % (ref 12.0–46.0)
Lymphs Abs: 2.4 10*3/uL (ref 0.7–4.0)
MCHC: 32.8 g/dL (ref 30.0–36.0)
MCV: 91.7 fl (ref 78.0–100.0)
Monocytes Absolute: 0.7 10*3/uL (ref 0.1–1.0)
Monocytes Relative: 6.5 % (ref 3.0–12.0)
Neutro Abs: 7.7 10*3/uL (ref 1.4–7.7)
Neutrophils Relative %: 70.4 % (ref 43.0–77.0)
Platelets: 234 10*3/uL (ref 150.0–400.0)
RBC: 4.57 Mil/uL (ref 3.87–5.11)
RDW: 12.7 % (ref 11.5–15.5)
WBC: 10.9 10*3/uL — ABNORMAL HIGH (ref 4.0–10.5)

## 2021-11-20 LAB — URINALYSIS, ROUTINE W REFLEX MICROSCOPIC
Bilirubin Urine: NEGATIVE
Ketones, ur: NEGATIVE
Nitrite: NEGATIVE
Specific Gravity, Urine: 1.01 (ref 1.000–1.030)
Total Protein, Urine: NEGATIVE
Urine Glucose: NEGATIVE
Urobilinogen, UA: 0.2 (ref 0.0–1.0)
pH: 6 (ref 5.0–8.0)

## 2021-11-20 LAB — POCT URINALYSIS DIPSTICK
Bilirubin, UA: NEGATIVE
Glucose, UA: NEGATIVE
Ketones, UA: NEGATIVE
Nitrite, UA: NEGATIVE
Protein, UA: NEGATIVE
Spec Grav, UA: 1.015 (ref 1.010–1.025)
Urobilinogen, UA: 0.2 E.U./dL
pH, UA: 5.5 (ref 5.0–8.0)

## 2021-11-20 LAB — COMPREHENSIVE METABOLIC PANEL
ALT: 26 U/L (ref 0–35)
AST: 25 U/L (ref 0–37)
Albumin: 4.4 g/dL (ref 3.5–5.2)
Alkaline Phosphatase: 59 U/L (ref 39–117)
BUN: 19 mg/dL (ref 6–23)
CO2: 23 mEq/L (ref 19–32)
Calcium: 9 mg/dL (ref 8.4–10.5)
Chloride: 102 mEq/L (ref 96–112)
Creatinine, Ser: 0.83 mg/dL (ref 0.40–1.20)
GFR: 80.83 mL/min (ref >60.00)
Glucose, Bld: 108 mg/dL — ABNORMAL HIGH (ref 70–99)
Potassium: 4.2 mEq/L (ref 3.5–5.1)
Sodium: 134 mEq/L — ABNORMAL LOW (ref 135–145)
Total Bilirubin: 0.3 mg/dL (ref 0.2–1.2)
Total Protein: 7.4 g/dL (ref 6.0–8.3)

## 2021-11-20 LAB — TSH: TSH: 2.12 u[IU]/mL (ref 0.35–5.50)

## 2021-11-20 MED ORDER — TERCONAZOLE 0.8 % VA CREA: 1.0000 | TOPICAL_CREAM | Freq: Every day | VAGINAL | 0 refills | Status: DC

## 2021-11-20 MED ORDER — CEPHALEXIN 500 MG PO CAPS: 500.0000 mg | ORAL_CAPSULE | Freq: Three times a day (TID) | ORAL | 0 refills | Status: AC

## 2021-11-20 NOTE — Assessment & Plan Note (Signed)
Urine culture done last week at [facility] showed large blood and large leukocytes no culture was done, given her duration of symptoms will start her on Keflex, POCT urine here in office 11/20/21 is positive as well and sent for urinalysis and culture.  Discussed signs of kidney infection and reasons to call seek treatment.

## 2021-11-20 NOTE — Patient Instructions (Signed)
Urinary Tract Infection, Adult °A urinary tract infection (UTI) is an infection of any part of the urinary tract. The urinary tract includes: °The kidneys. °The ureters. °The bladder. °The urethra. °These organs make, store, and get rid of pee (urine) in the body. °What are the causes? °This infection is caused by germs (bacteria) in your genital area. These germs grow and cause swelling (inflammation) of your urinary tract. °What increases the risk? °The following factors may make you more likely to develop this condition: °Using a small, thin tube (catheter) to drain pee. °Not being able to control when you pee or poop (incontinence). °Being female. If you are female, these things can increase the risk: °Using these methods to prevent pregnancy: °A medicine that kills sperm (spermicide). °A device that blocks sperm (diaphragm). °Having low levels of a female hormone (estrogen). °Being pregnant. °You are more likely to develop this condition if: °You have genes that add to your risk. °You are sexually active. °You take antibiotic medicines. °You have trouble peeing because of: °A prostate that is bigger than normal, if you are female. °A blockage in the part of your body that drains pee from the bladder. °A kidney stone. °A nerve condition that affects your bladder. °Not getting enough to drink. °Not peeing often enough. °You have other conditions, such as: °Diabetes. °A weak disease-fighting system (immune system). °Sickle cell disease. °Gout. °Injury of the spine. °What are the signs or symptoms? °Symptoms of this condition include: °Needing to pee right away. °Peeing small amounts often. °Pain or burning when peeing. °Blood in the pee. °Pee that smells bad or not like normal. °Trouble peeing. °Pee that is cloudy. °Fluid coming from the vagina, if you are female. °Pain in the belly or lower back. °Other symptoms include: °Vomiting. °Not feeling hungry. °Feeling mixed up (confused). This may be the first symptom in  older adults. °Being tired and grouchy (irritable). °A fever. °Watery poop (diarrhea). °How is this treated? °Taking antibiotic medicine. °Taking other medicines. °Drinking enough water. °In some cases, you may need to see a specialist. °Follow these instructions at home: °Medicines °Take over-the-counter and prescription medicines only as told by your doctor. °If you were prescribed an antibiotic medicine, take it as told by your doctor. Do not stop taking it even if you start to feel better. °General instructions °Make sure you: °Pee until your bladder is empty. °Do not hold pee for a long time. °Empty your bladder after sex. °Wipe from front to back after peeing or pooping if you are a female. Use each tissue one time when you wipe. °Drink enough fluid to keep your pee pale yellow. °Keep all follow-up visits. °Contact a doctor if: °You do not get better after 1-2 days. °Your symptoms go away and then come back. °Get help right away if: °You have very bad back pain. °You have very bad pain in your lower belly. °You have a fever. °You have chills. °You feeling like you will vomit or you vomit. °Summary °A urinary tract infection (UTI) is an infection of any part of the urinary tract. °This condition is caused by germs in your genital area. °There are many risk factors for a UTI. °Treatment includes antibiotic medicines. °Drink enough fluid to keep your pee pale yellow. °This information is not intended to replace advice given to you by your health care provider. Make sure you discuss any questions you have with your health care provider. °Document Revised: 05/19/2020 Document Reviewed: 05/19/2020 °Elsevier Patient Education © 2022   [place] vaginal cream What is this medication? TERCONAZOLE (ter KON a zole) is an antifungal medicine. It is used to treat yeast infections of the vagina. This medicine may be used for other purposes; ask your health care provider or pharmacist if you have  questions. COMMON BRAND NAME(S): Terazol 3, Terazol 7, Zazole What should I tell my care team before I take this medication? They need to know if you have any of these conditions: an unusual or allergic reaction to terconazole, other antifungals, other medicines, foods, dyes or preservatives pregnant or trying to get pregnant breast-feeding How should I use this medication? This medicine is only for use in the vagina. Do not take by mouth. Wash hands before and after use. Read package directions carefully before using. Use this medicine at bedtime, unless otherwise directed by your doctor or health care professional. Screw the applicator onto the end of the tube and squeeze the tube to fill the applicator. Remove the applicator from the tube. Lie on your back. Gently insert the applicator tip high in the vagina and push the plunger to release the cream into the vagina. Gently remove the applicator. Wash the applicator well with warm water and soap. Use at regular intervals. Do not get this medicine in your eyes. If you do, rinse out with plenty of cool tap water. Finish the full course prescribed by your doctor or health care professional even if you think your condition is better. Do not stop using this medicine if your menstrual period starts during the time of treatment. Talk to your pediatrician regarding the use of this medicine in children. Special care may be needed. Overdosage: If you think you have taken too much of this medicine contact a poison control center or emergency room at once. NOTE: This medicine is only for you. Do not share this medicine with others. What if I miss a dose? If you miss a dose, use it as soon as you can. If it is almost time for your next dose, use only that dose. Do not use double or extra doses. What may interact with this medication? Interactions are not expected. Do not use any other vaginal products without telling your doctor or health care  professional. This list may not describe all possible interactions. Give your health care provider a list of all the medicines, herbs, non-prescription drugs, or dietary supplements you use. Also tell them if you smoke, drink alcohol, or use illegal drugs. Some items may interact with your medicine. What should I watch for while using this medication? Tell your doctor or health care professional if your symptoms do not start to get better within a few days. It is better not to have sex until you have finished your treatment. If you have sex, your partner should use a condom during sex to help prevent transfer of the infection. Your sexual partner may also need treatment. Vaginal medicines usually will come out of the vagina during treatment. To keep the medicine from getting on your clothing, wear a mini-pad or sanitary napkin. The use of tampons is not recommended since they may soak up the medicine. To help clear up the infection, wear freshly washed cotton, not synthetic, underwear. What side effects may I notice from receiving this medication? Side effects that you should report to your doctor or health care professional as soon as possible: painful or difficult urination vaginal pain Side effects that usually do not require medical attention (report to your doctor or health care  professional if they continue or are bothersome): headache menstrual pain stomach upset vaginal irritation, itching or burning This list may not describe all possible side effects. Call your doctor for medical advice about side effects. You may report side effects to FDA at 1-800-FDA-1088. Where should I keep my medication? Keep out of the reach of children. Store at room temperature between 15 and 30 degrees C (59 and 86 degrees F). Throw away any unused medicine after the expiration date. NOTE: This sheet is a summary. It may not cover all possible information. If you have questions about this medicine, talk to your  doctor, pharmacist, or health care provider.  2022 Elsevier/Gold Standard (2008-06-24 00:00:00) Cephalexin Capsules or Tablets What is this medication? CEPHALEXIN (sef a LEX in) treats infections caused by bacteria. It belongs to a group of medications called cephalosporin antibiotics. It will not treat colds, the flu, or infections caused by viruses. This medicine may be used for other purposes; ask your health care provider or pharmacist if you have questions. COMMON BRAND NAME(S): Biocef, Daxbia, Keflex, Keftab What should I tell my care team before I take this medication? They need to know if you have any of these conditions: Bleeding disorder Kidney disease Liver disease Seizures Stomach or intestine problems like colitis An unusual or allergic reaction to cephalexin, other penicillin or cephalosporin antibiotics, other medications, foods, dyes, or preservatives Pregnant or trying to get pregnant Breast-feeding How should I use this medication? Take this medication by mouth. Take it as directed on the prescription label at the same time every day. You can take it with or without food. If it upsets your stomach, take it with food. Take all of this medication unless your care team tells you to stop it early. Keep taking it even if you think you are better. Talk to your care team about the use of this medication in children. While it may be prescribed for selected conditions, precautions do apply. Overdosage: If you think you have taken too much of this medicine contact a poison control center or emergency room at once. NOTE: This medicine is only for you. Do not share this medicine with others. What if I miss a dose? If you miss a dose, take it as soon as you can. If it is almost time for your next dose, take only that dose. Do not take double or extra doses. What may interact with this medication? Probenecid Some other antibiotics This list may not describe all possible interactions.  Give your health care provider a list of all the medicines, herbs, non-prescription drugs, or dietary supplements you use. Also tell them if you smoke, drink alcohol, or use illegal drugs. Some items may interact with your medicine. What should I watch for while using this medication? Tell your care team if your symptoms do not start to get better or if they get worse. Do not treat diarrhea with over the counter products. Contact your care team if you have diarrhea that lasts more than 2 days or if it is severe and watery. This medication may cause serious skin reactions. They can happen weeks to months after starting the medication. Contact your care team right away if you notice fevers or flu-like symptoms with a rash. The rash may be red or purple and then turn into blisters or peeling of the skin. Or, you might notice a red rash with swelling of the face, lips or lymph nodes in your neck or under your arms. If you have diabetes,  you may get a false-positive result for sugar in your urine. Check with your care team. What side effects may I notice from receiving this medication? Side effects that you should report to your care team as soon as possible: Allergic reactions--skin rash, itching, hives, swelling of the face, lips, tongue, or throat Redness, blistering, peeling, or loosening of the skin, including inside the mouth Severe diarrhea, fever Unusual vaginal discharge, itching, or odor Side effects that usually do not require medical attention (report to your care team if they continue or are bothersome): Diarrhea Headache Nausea This list may not describe all possible side effects. Call your doctor for medical advice about side effects. You may report side effects to FDA at 1-800-FDA-1088. Where should I keep my medication? Keep out of the reach of children and pets. Store at room temperature between 20 and 25 degrees C (68 and 77 degrees F). Throw away any unused medication after the  expiration date. NOTE: This sheet is a summary. It may not cover all possible information. If you have questions about this medicine, talk to your doctor, pharmacist, or health care provider.  2022 Elsevier/Gold Standard (2020-10-02 00:00:00)

## 2021-11-20 NOTE — Progress Notes (Addendum)
Acute Office Visit  Subjective:    Patient ID: Kristin Davis, female    DOB: 1971-02-09, 51 y.o.   MRN: [id number]  Chief Complaint  Patient presents with   Acute Visit    Pt arrives to office c/o urinary sx's still having burning and bleeding has returned. Has been using replense over the weekend with some improvement.     Urinary Tract Infection  This is a new problem. The current episode started 1 to 4 weeks ago. The problem occurs intermittently. The problem has been gradually improving. The quality of the pain is described as burning. The pain is mild. There has been no fever. There is No history of pyelonephritis. Associated symptoms include frequency and hematuria. Pertinent negatives include no chills, discharge, flank pain, hesitancy, nausea, possible pregnancy, sweats, urgency or vomiting.    She  took diflucan x 2 dose.  She also is on flagyl oral for BV. Wet prep in [facility] was seen five days ago was positive for yeast and clue cells. Urinalysis was positive for blood and leukocytes. No culture was performed. Diagnosed with BV and yeast. Flagyl 500 mg BID for 7 days was given 1/26/263 She is still having urinary frequency and some hematuria.  She has been using Replens as well for moisture.   Has noted some weight gain since summer about 10 lbs, diet has been ok could use room for improvement would like TSH checked today.   Patient  denies any fever, body aches,chills, rash, chest pain, shortness of breath, nausea, vomiting, or diarrhea.  Denies dizziness, lightheadedness, pre syncopal or syncopal episodes.     Past Medical History:  Diagnosis Date   GERD (gastroesophageal reflux disease)     Past Surgical History:  Procedure Laterality Date   ABDOMINAL HYSTERECTOMY  2015   BREAST BIOPSY Right 09/30/2013   benign   COLONOSCOPY WITH PROPOFOL N/A 07/30/2019   Procedure: COLONOSCOPY WITH PROPOFOL;  Surgeon: Lollie Sails, MD;  Location: [facility] ENDOSCOPY;   Service: Endoscopy;  Laterality: N/A;   ESOPHAGOGASTRODUODENOSCOPY (EGD) WITH PROPOFOL N/A 07/30/2019   Procedure: ESOPHAGOGASTRODUODENOSCOPY (EGD) WITH PROPOFOL;  Surgeon: Lollie Sails, MD;  Location: [facility] ENDOSCOPY;  Service: Endoscopy;  Laterality: N/A;    Family History  Problem Relation Age of Onset   Heart attack Father    Alcohol abuse Father    Aortic aneurysm Father    Cancer Maternal Grandmother        ovarian   Thyroid disease Mother     Social History   Socioeconomic History   Marital status: Married    Spouse name: Not on file   Number of children: Not on file   Years of education: Not on file   Highest education level: Not on file  Occupational History   Not on file  Tobacco Use   Smoking status: Never   Smokeless tobacco: Never  Vaping Use   Vaping Use: Never used  Substance and Sexual Activity   Alcohol use: No   Drug use: No   Sexual activity: Not on file  Other Topics Concern   Not on file  Social History Narrative   Not on file   Social Determinants of Health   Financial Resource Strain: Not on file  Food Insecurity: Not on file  Transportation Needs: Not on file  Physical Activity: Not on file  Stress: Not on file  Social Connections: Not on file  Intimate Partner Violence: Not on file    Outpatient Medications  Prior to Visit  Medication Sig Dispense Refill   cholecalciferol (VITAMIN D3) 25 MCG (1000 UNIT) tablet Take 1,000 Units by mouth daily.     MAGNESIUM GLUCONATE PO Take 1 tablet by mouth daily.     metroNIDAZOLE (FLAGYL) 500 MG tablet Take 1 tablet (500 mg total) by mouth 2 (two) times daily. 14 tablet 0   Multiple Vitamin (MULTIVITAMIN) tablet Take 1 tablet by mouth daily.     phenazopyridine (PYRIDIUM) 200 MG tablet Take 1 tablet (200 mg total) by mouth 3 (three) times daily. (Patient not taking: Reported on 11/20/2021) 6 tablet 0   No facility-administered medications prior to visit.    Allergies  Allergen Reactions    Cat Hair Extract    Sudafed [Pseudoephedrine] Anxiety    Review of Systems  Constitutional: Negative.  Negative for chills.  HENT: Negative.    Respiratory: Negative.    Cardiovascular: Negative.   Gastrointestinal: Negative.  Negative for abdominal distention, constipation, nausea and vomiting.  Genitourinary:  Positive for frequency and hematuria. Negative for dysuria, enuresis, flank pain, genital sores, hesitancy, menstrual problem, urgency, vaginal discharge and vaginal pain.  Musculoskeletal: Negative.   Skin: Negative.   Neurological: Negative.   Psychiatric/Behavioral: Negative.        Objective:    Physical Exam    General: Appearance:    Obese female in no acute distress  Eyes:    PERRL, conjunctiva/corneas clear, EOM's intact       Lungs:     Clear to auscultation bilaterally, respirations unlabored  Heart:    Normal heart rate. Normal rhythm. No murmurs, rubs, or gallops.    MS:   All extremities are intact.    Neurologic:   Awake, alert, oriented x 3. No apparent focal neurological           defect.    Abdomen: soft and non-tender without masses, organomegaly or hernias noted.  No guarding or rebound  no bilateral  CVA tenderness.   BP 118/70 (BP Location: Left Arm, Patient Position: Sitting, Cuff Size: Large)    Pulse 88    Temp 98.9 F (37.2 C) (Oral)    Resp 14    Ht 5\' 6"  (1.676 m)    Wt 226 lb 3.2 oz (102.6 kg)    LMP 10/28/2012    SpO2 97%    BMI 36.51 kg/m  Wt Readings from Last 3 Encounters:  11/20/21 226 lb 3.2 oz (102.6 kg)  11/15/21 220 lb (99.8 kg)  03/28/21 216 lb 3.2 oz (98.1 kg)    Health Maintenance Due  Topic Date Due   COVID-19 Vaccine (1) Never done   HIV Screening  Never done   Zoster Vaccines- Shingrix (1 of 2) Never done   PAP SMEAR-Modifier  06/11/2014   TETANUS/TDAP  11/11/2017   INFLUENZA VACCINE  05/21/2021    There are no preventive care reminders to display for this patient.   Lab Results  Component Value Date   TSH  0.81 08/29/2020   Lab Results  Component Value Date   WBC 7.2 08/29/2020   HGB 13.8 08/29/2020   HCT 40.3 08/29/2020   MCV 91.3 08/29/2020   PLT 202.0 08/29/2020   Lab Results  Component Value Date   NA 137 03/28/2021   K 4.0 03/28/2021   CO2 25 03/28/2021   GLUCOSE 86 03/28/2021   BUN 22 03/28/2021   CREATININE 0.79 03/28/2021   BILITOT 0.6 03/28/2021   ALKPHOS 61 03/28/2021   AST 16 03/28/2021  ALT 14 03/28/2021   PROT 7.4 03/28/2021   ALBUMIN 4.5 03/28/2021   CALCIUM 9.4 03/28/2021   GFR 86.16 03/28/2021   Lab Results  Component Value Date   CHOL 235 (H) 03/28/2021   Lab Results  Component Value Date   HDL 53.00 03/28/2021   Lab Results  Component Value Date   LDLCALC 159 (H) 03/28/2021   Lab Results  Component Value Date   TRIG 112.0 03/28/2021   Lab Results  Component Value Date   CHOLHDL 4 03/28/2021   Lab Results  Component Value Date   HGBA1C 5.5 06/25/2019       Assessment & Plan:   Problem List Items Addressed This Visit       Genitourinary   Bacterial vaginosis    Complete flagyl as prescribed from urgent care.       Relevant Medications   cephALEXin (KEFLEX) 500 MG capsule     Other   Hx of hysterectomy, total    May need exam if symptoms persist once treatment complete and retesting swab vaginal. ? Estrogen deficiency dryness. She will schedule if any symptoms persist.       Symptoms of urinary tract infection - Primary    Urine culture done last week at [facility] showed large blood and large leukocytes no culture was done, given her duration of symptoms will start her on Keflex, POCT urine here in office 11/20/21 is positive as well and sent for urinalysis and culture.  Discussed signs of kidney infection and reasons to call seek treatment.        Relevant Medications   cephALEXin (KEFLEX) 500 MG capsule   Other Relevant Orders   Urinalysis, Routine w reflex microscopic   Urine Culture   POCT urinalysis dipstick (Completed)    CBC with Differential/Platelet   Comprehensive metabolic panel   Yeast infection    Changing to Terazol cream vaginal as directed.       Relevant Medications   cephALEXin (KEFLEX) 500 MG capsule   terconazole (TERAZOL 3) 0.8 % vaginal cream   Weight gain   Relevant Orders   TSH   Return for pelvic exam/ vaginal repeat swab after treatment.  If microscopic returns with blood will need repeat urinalysis with microanalysis  1- 2 weeks after treatment to be sure resolution of blood. No alcohol while on Flagyl and advise none for 1 week after.  The patient is advised to begin progressive daily aerobic exercise program, attempt to lose weight, continue current medications, continue current healthy lifestyle patterns, and return for routine annual checkups.  Red Flags discussed. The patient was given clear instructions to go to ER or return to medical center if any red flags develop, symptoms do not improve, worsen or new problems develop. They verbalized understanding.  Return if symptoms worsen or fail to improve, for at any time for any worsening symptoms, Go to Emergency room/ urgent care if worse.   Marcille Buffy, FNP

## 2021-11-20 NOTE — Assessment & Plan Note (Signed)
Changing to Terazol cream vaginal as directed.

## 2021-11-20 NOTE — Assessment & Plan Note (Signed)
Complete flagyl as prescribed from urgent care.

## 2021-11-20 NOTE — Assessment & Plan Note (Signed)
May need exam if symptoms persist once treatment complete and retesting swab vaginal. ? Estrogen deficiency dryness. She will schedule if any symptoms persist.

## 2021-11-21 LAB — URINE CULTURE
MICRO NUMBER:: 12943388
Result:: NO GROWTH
SPECIMEN QUALITY:: ADEQUATE

## 2021-11-21 NOTE — Progress Notes (Signed)
Her white blood cell is elevated slightly, started on antibiotics for infection on 11/20/21 this is likely the cause.  Watch for signs of worsening infection, fever, chills etc.  CMP ok glucose non fasting. Sodium trivial low, she can drink some electrolyte drink for few days like liquid IV, Gatorade, vitamin water.  Bacteria and blood still in this urinalysis we did  yesterday, sent for culture. Continue antibiotics. TSH is within normal limits for thyroid. Need to recheck CBC and urine microscopic / culture 1 week after she completes medication.

## 2021-11-21 NOTE — Progress Notes (Signed)
No growth in urine culture. Follow up for any persistent symptoms.

## 2021-11-29 ENCOUNTER — Other Ambulatory Visit: Payer: BC Managed Care – PPO

## 2021-11-30 ENCOUNTER — Telehealth: Payer: Self-pay | Admitting: *Deleted

## 2021-11-30 ENCOUNTER — Encounter: Payer: Self-pay | Admitting: Adult Health

## 2021-11-30 DIAGNOSIS — R399 Unspecified symptoms and signs involving the genitourinary system: Secondary | ICD-10-CM

## 2021-11-30 DIAGNOSIS — D72829 Elevated white blood cell count, unspecified: Secondary | ICD-10-CM

## 2021-11-30 MED ORDER — NYSTATIN 100000 UNIT/ML MT SUSP: 5.0000 mL | Freq: Four times a day (QID) | OROMUCOSAL | 0 refills | Status: DC

## 2021-11-30 NOTE — Telephone Encounter (Signed)
Please place future orders for lab appt.  

## 2021-12-03 ENCOUNTER — Other Ambulatory Visit: Payer: Self-pay

## 2021-12-03 ENCOUNTER — Ambulatory Visit (INDEPENDENT_AMBULATORY_CARE_PROVIDER_SITE_OTHER): Payer: BC Managed Care – PPO | Admitting: Dermatology

## 2021-12-03 ENCOUNTER — Encounter: Payer: Self-pay | Admitting: Dermatology

## 2021-12-03 DIAGNOSIS — D229 Melanocytic nevi, unspecified: Secondary | ICD-10-CM | POA: Diagnosis not present

## 2021-12-03 DIAGNOSIS — D225 Melanocytic nevi of trunk: Secondary | ICD-10-CM | POA: Diagnosis not present

## 2021-12-03 DIAGNOSIS — Z1283 Encounter for screening for malignant neoplasm of skin: Secondary | ICD-10-CM | POA: Diagnosis not present

## 2021-12-03 DIAGNOSIS — L820 Inflamed seborrheic keratosis: Secondary | ICD-10-CM | POA: Diagnosis not present

## 2021-12-03 DIAGNOSIS — L578 Other skin changes due to chronic exposure to nonionizing radiation: Secondary | ICD-10-CM

## 2021-12-03 DIAGNOSIS — D489 Neoplasm of uncertain behavior, unspecified: Secondary | ICD-10-CM

## 2021-12-03 DIAGNOSIS — D1800 Hemangioma unspecified site: Secondary | ICD-10-CM

## 2021-12-03 DIAGNOSIS — L814 Other melanin hyperpigmentation: Secondary | ICD-10-CM

## 2021-12-03 DIAGNOSIS — L821 Other seborrheic keratosis: Secondary | ICD-10-CM

## 2021-12-03 NOTE — Patient Instructions (Addendum)
Cryotherapy Aftercare  Wash gently with soap and water everyday.   Apply Vaseline and Band-Aid daily until healed.   Prior to procedure, discussed risks of blister formation, small wound, skin dyspigmentation, or rare scar following cryotherapy. Recommend Vaseline ointment to treated areas while healing.   Wound Care Instructions  Cleanse wound gently with soap and water once a day then pat dry with clean gauze. Apply a thing coat of Petrolatum (petroleum jelly, "Vaseline") over the wound (unless you have an allergy to this). We recommend that you use a new, sterile tube of Vaseline. Do not pick or remove scabs. Do not remove the yellow or white "healing tissue" from the base of the wound.  Cover the wound with fresh, clean, nonstick gauze and secure with paper tape. You may use Band-Aids in place of gauze and tape if the would is small enough, but would recommend trimming much of the tape off as there is often too much. Sometimes Band-Aids can irritate the skin.  You should call the office for your biopsy report after 1 week if you have not already been contacted.  If you experience any problems, such as abnormal amounts of bleeding, swelling, significant bruising, significant pain, or evidence of infection, please call the office immediately.  FOR ADULT SURGERY PATIENTS: If you need something for pain relief you may take 1 extra strength Tylenol (acetaminophen) AND 2 Ibuprofen (200mg  each) together every 4 hours as needed for pain. (do not take these if you are allergic to them or if you have a reason you should not take them.) Typically, you may only need pain medication for 1 to 3 days.     Melanoma ABCDEs  Melanoma is the most dangerous type of skin cancer, and is the leading cause of death from skin disease.  You are more likely to develop melanoma if you: Have light-colored skin, light-colored eyes, or red or blond hair Spend a lot of time in the sun Tan regularly, either outdoors or  in a tanning bed Have had blistering sunburns, especially during childhood Have a close family member who has had a melanoma Have atypical moles or large birthmarks  Early detection of melanoma is key since treatment is typically straightforward and cure rates are extremely high if we catch it early.   The first sign of melanoma is often a change in a mole or a new dark spot.  The ABCDE system is a way of remembering the signs of melanoma.  A for asymmetry:  The two halves do not match. B for border:  The edges of the growth are irregular. C for color:  A mixture of colors are present instead of an even brown color. D for diameter:  Melanomas are usually (but not always) greater than 86mm - the size of a pencil eraser. E for evolution:  The spot keeps changing in size, shape, and color.  Please check your skin once per month between visits. You can use a small mirror in front and a large mirror behind you to keep an eye on the back side or your body.   If you see any new or changing lesions before your next follow-up, please call to schedule a visit.  Please continue daily skin protection including broad spectrum sunscreen SPF 30+ to sun-exposed areas, reapplying every 2 hours as needed when you're outdoors.   Staying in the shade or wearing long sleeves, sun glasses (UVA+UVB protection) and wide brim hats (4-inch brim around the entire circumference of the  hat) are also recommended for sun protection.    If You Need Anything After Your Visit  If you have any questions or concerns for your doctor, please call our main line at [phone] and press option 4 to reach your doctor's medical assistant. If no one answers, please leave a voicemail as directed and we will return your call as soon as possible. Messages left after 4 pm will be answered the following business day.   You may also send [place] a message via [place]. We typically respond to MyChart messages within 1-2 business days.  For  prescription refills, please ask your pharmacy to contact our office. Our fax number is [phone].  If you have an urgent issue when the clinic is closed that cannot wait until the next business day, you can page your doctor at the number below.    Please note that while we do our best to be available for urgent issues outside of office hours, we are not available 24/7.   If you have an urgent issue and are unable to reach [place], you may choose to seek medical care at your doctor's office, retail clinic, urgent care center, or emergency room.  If you have a medical emergency, please immediately call 911 or go to the emergency department.  Pager Numbers  - Dr. Nehemiah Massed: [phone]  - Dr. Laurence Ferrari: [phone]  - Dr. Nicole Kindred: [phone]  In the event of inclement weather, please call our main line at [phone] for an update on the status of any delays or closures.  Dermatology Medication Tips: Please keep the boxes that topical medications come in in order to help keep track of the instructions about where and how to use these. Pharmacies typically print the medication instructions only on the boxes and not directly on the medication tubes.   If your medication is too expensive, please contact our office at [phone] option 4 or send [place] a message through [place].   We are unable to tell what your co-pay for medications will be in advance as this is different depending on your insurance coverage. However, we may be able to find a substitute medication at lower cost or fill out paperwork to get insurance to cover a needed medication.   If a prior authorization is required to get your medication covered by your insurance company, please allow [place] 1-2 business days to complete this process.  Drug prices often vary depending on where the prescription is filled and some pharmacies may offer cheaper prices.  The website www.goodrx.com contains coupons for medications through different  pharmacies. The prices here do not account for what the cost may be with help from insurance (it may be cheaper with your insurance), but the website can give you the price if you did not use any insurance.  - You can print the associated coupon and take it with your prescription to the pharmacy.  - You may also stop by our office during regular business hours and pick up a GoodRx coupon card.  - If you need your prescription sent electronically to a different pharmacy, notify our office through [facility] or by phone at [phone] option 4.     Si Usted Necesita Algo Despus de Su Visita  Tambin puede enviarnos un mensaje a travs de Pharmacist, community. Por lo general respondemos a los mensajes de MyChart en el transcurso de 1 a 2 das hbiles.  Para renovar recetas, por favor pida a su farmacia que se ponga en contacto con Cleotis Nipper  oficina. Harland Dingwall de fax es [place] [phone].  Si tiene un asunto urgente cuando la clnica est cerrada y que no puede esperar hasta el siguiente da hbil, puede llamar/localizar a su doctor(a) al nmero que aparece a continuacin.   Por favor, tenga en cuenta que aunque hacemos todo lo posible para estar disponibles para asuntos urgentes fuera del horario de [place], no estamos disponibles las 24 horas del da, los 7 das de la [place].   Si tiene un problema urgente y no puede comunicarse con nosotros, puede optar por buscar atencin mdica  en el consultorio de su doctor(a), en una clnica privada, en un centro de atencin urgente o en una sala de emergencias.  Si tiene Engineering geologist, por favor llame inmediatamente al 911 o vaya a la sala de emergencias.  Nmeros de bper  - Dr. Nehemiah Massed: [phone]  - Dra. Moye: [phone]  - Dra. Nicole Kindred: [phone]  En caso de inclemencias del [place], por favor llame a Johnsie Kindred principal al [phone] para una actualizacin sobre el [place] de cualquier retraso o cierre.  Consejos para la  medicacin en dermatologa: Por favor, guarde las cajas en las que vienen los medicamentos de uso tpico para ayudarle a seguir las instrucciones sobre dnde y cmo usarlos. Las farmacias generalmente imprimen las instrucciones del medicamento slo en las cajas y no directamente en los tubos del [place].   Si su medicamento es muy caro, por favor, pngase en contacto con Zigmund Daniel llamando al [phone] y presione la opcin 4 o envenos un mensaje a travs de Pharmacist, community.   No podemos decirle cul ser su copago por los medicamentos por adelantado ya que esto es diferente dependiendo de la cobertura de su seguro. Sin embargo, es posible que podamos encontrar un medicamento sustituto a Electrical engineer un formulario para que el seguro cubra el medicamento que se considera necesario.   Si se requiere una autorizacin previa para que su compaa de seguros [place] su medicamento, por favor permtanos de 1 a 2 das hbiles para completar este proceso.  Los precios de los medicamentos varan con frecuencia dependiendo del Environmental consultant de dnde se surte la receta y alguna farmacias pueden ofrecer precios ms baratos.  El sitio web www.goodrx.com tiene cupones para medicamentos de Airline pilot. Los precios aqu no tienen en cuenta lo que podra costar con la ayuda del seguro (puede ser ms barato con su seguro), pero el sitio web puede darle el precio si no utiliz Research scientist (physical sciences).  - Puede imprimir el cupn correspondiente y llevarlo con su receta a la farmacia.  - Tambin puede pasar por nuestra oficina durante el horario de atencin regular y Charity fundraiser una tarjeta de cupones de GoodRx.  - Si necesita que su receta se enve electrnicamente a una farmacia diferente, informe a nuestra oficina a travs de MyChart de [place] o por telfono llamando al [phone] y presione la opcin 4.

## 2021-12-03 NOTE — Progress Notes (Deleted)
° °  Follow-Up Visit   Subjective  Kristin Davis is a 51 y.o. female who presents for the following: lesion (Left breast. Dur: 6 months. Dry patch. Denies itch, non tender. Also has mole at buttock area).  The patient presents for Total-Body Skin Exam (TBSE) for skin cancer screening and mole check.  The patient has spots, moles and lesions to be evaluated, some may be new or changing and the patient has concerns that these could be cancer.    The following portions of the chart were reviewed this encounter and updated as appropriate:      Review of Systems: No other skin or systemic complaints except as noted in HPI or Assessment and Plan.   Objective  Well appearing patient in no apparent distress; mood and affect are within normal limits.  A focused examination was performed including face, breast. Relevant physical exam findings are noted in the Assessment and Plan.   Assessment & Plan   No follow-ups on file.  IEmelia Salisbury, CMA, am acting as scribe for Sarina Ser, MD.

## 2021-12-03 NOTE — Progress Notes (Signed)
New Patient Visit  Subjective  Kristin Davis is a 51 y.o. female who presents for the following: Annual Exam (Left breast. Dur: 6 months. Dry patch. Denies itch, non tender. Also has mole at buttock area. No personal hx of skin cancer). The patient presents for Total-Body Skin Exam (TBSE) for skin cancer screening and mole check.  The patient has spots, moles and lesions to be evaluated, some may be new or changing and the patient has concerns that these could be cancer.  Objective  Well appearing patient in no apparent distress; mood and affect are within normal limits.  Review of Systems: No other skin or systemic complaints except as noted in HPI or Assessment and Plan.  A full examination was performed including scalp, head, eyes, ears, nose, lips, neck, chest, axillae, abdomen, back, buttocks, bilateral upper extremities, bilateral lower extremities, hands, feet, fingers, toes, fingernails, and toenails. All findings within normal limits unless otherwise noted below.  left breast areola x1 Erythematous keratotic or waxy stuck-on papule or plaque.  Right Gluteal Crease 0.7 cm Fleshy, skin-colored pedunculated papule   Assessment & Plan   Lentigines - Scattered tan macules - Due to sun exposure - Benign-appearing, observe - Recommend daily broad spectrum sunscreen SPF 30+ to sun-exposed areas, reapply every 2 hours as needed. - Call for any changes  Seborrheic Keratoses - Stuck-on, waxy, tan-brown papules and/or plaques  - Benign-appearing - Discussed benign etiology and prognosis. - Observe - Call for any changes  Melanocytic Nevi - Tan-brown and/or pink-flesh-colored symmetric macules and papules - Benign appearing on exam today - Observation - Call clinic for new or changing moles - Recommend daily use of broad spectrum spf 30+ sunscreen to sun-exposed areas.   Hemangiomas - Red papules - Discussed benign nature - Observe - Call for any changes  Actinic  Damage - Chronic condition, secondary to cumulative UV/sun exposure - diffuse scaly erythematous macules with underlying dyspigmentation - Recommend daily broad spectrum sunscreen SPF 30+ to sun-exposed areas, reapply every 2 hours as needed.  - Staying in the shade or wearing long sleeves, sun glasses (UVA+UVB protection) and wide brim hats (4-inch brim around the entire circumference of the hat) are also recommended for sun protection.  - Call for new or changing lesions.  Skin cancer screening performed today.  Inflamed seborrheic keratosis left breast areola x1  Destruction of lesion - left breast areola x1 Complexity: simple   Destruction method: cryotherapy   Informed consent: discussed and consent obtained   Timeout:  patient name, date of birth, surgical site, and procedure verified Lesion destroyed using liquid nitrogen: Yes   Region frozen until ice ball extended beyond lesion: Yes   Outcome: patient tolerated procedure well with no complications   Post-procedure details: wound care instructions given    Neoplasm of uncertain behavior Right Gluteal Crease  Epidermal / dermal shaving  Lesion diameter (cm):  0.7 Informed consent: discussed and consent obtained   Timeout: patient name, date of birth, surgical site, and procedure verified   Procedure prep:  Patient was prepped and draped in usual sterile fashion Prep type:  Isopropyl alcohol Anesthesia: the lesion was anesthetized in a standard fashion   Anesthetic:  1% lidocaine w/ epinephrine 1-100,000 buffered w/ 8.4% NaHCO3 Instrument used: flexible razor blade   Hemostasis achieved with: pressure, aluminum chloride and electrodesiccation   Outcome: patient tolerated procedure well   Post-procedure details: sterile dressing applied and wound care instructions given   Dressing type: bandage and petrolatum  Specimen 1 - Surgical pathology Differential Diagnosis: irritated nevus R/O dyplasia  Check Margins:  No  Skin cancer screening   Return for TBSE 1-2 years.  I, Emelia Salisbury, CMA, am acting as scribe for Sarina Ser, MD. Documentation: I have reviewed the above documentation for accuracy and completeness, and I agree with the above.  Sarina Ser, MD

## 2021-12-05 ENCOUNTER — Telehealth: Payer: Self-pay

## 2021-12-05 NOTE — Telephone Encounter (Signed)
Advised patient of results/hd  

## 2021-12-05 NOTE — Telephone Encounter (Signed)
-----   Message from Ralene Bathe, MD sent at 12/05/2021  3:38 PM EST ----- Diagnosis Skin , right gluteal crease MELANOCYTIC NEVUS, INTRADERMAL TYPE, IRRITATED  Benign irritated mole No further treatment needed

## 2021-12-06 ENCOUNTER — Telehealth: Payer: Self-pay | Admitting: Internal Medicine

## 2021-12-06 ENCOUNTER — Other Ambulatory Visit: Payer: Self-pay

## 2021-12-06 ENCOUNTER — Other Ambulatory Visit (INDEPENDENT_AMBULATORY_CARE_PROVIDER_SITE_OTHER): Payer: BC Managed Care – PPO

## 2021-12-06 DIAGNOSIS — D72829 Elevated white blood cell count, unspecified: Secondary | ICD-10-CM

## 2021-12-06 DIAGNOSIS — R399 Unspecified symptoms and signs involving the genitourinary system: Secondary | ICD-10-CM | POA: Diagnosis not present

## 2021-12-06 NOTE — Telephone Encounter (Signed)
Pt requesting refill on medication (nystatin (MYCOSTATIN) 100000 UNIT/ML suspension). Pt stated that she have thrush. Pt stated that she is almost out of medication but still have several days left to still take. Pt requesting callback.

## 2021-12-07 ENCOUNTER — Other Ambulatory Visit: Payer: Self-pay | Admitting: Adult Health

## 2021-12-07 ENCOUNTER — Encounter: Payer: Self-pay | Admitting: Dermatology

## 2021-12-07 LAB — URINALYSIS, ROUTINE W REFLEX MICROSCOPIC
Bilirubin Urine: NEGATIVE
Hgb urine dipstick: NEGATIVE
Ketones, ur: NEGATIVE
Nitrite: NEGATIVE
Specific Gravity, Urine: 1.015 (ref 1.000–1.030)
Total Protein, Urine: NEGATIVE
Urine Glucose: NEGATIVE
Urobilinogen, UA: 0.2 (ref 0.0–1.0)
pH: 5.5 (ref 5.0–8.0)

## 2021-12-07 LAB — CBC WITH DIFFERENTIAL/PLATELET
Basophils Absolute: 0.1 10*3/uL (ref 0.0–0.1)
Basophils Relative: 0.9 % (ref 0.0–3.0)
Eosinophils Absolute: 0.1 10*3/uL (ref 0.0–0.7)
Eosinophils Relative: 1.9 % (ref 0.0–5.0)
HCT: 39.2 % (ref 36.0–46.0)
Hemoglobin: 13.4 g/dL (ref 12.0–15.0)
Lymphocytes Relative: 38 % (ref 12.0–46.0)
Lymphs Abs: 3 10*3/uL (ref 0.7–4.0)
MCHC: 34.1 g/dL (ref 30.0–36.0)
MCV: 91.3 fl (ref 78.0–100.0)
Monocytes Absolute: 0.7 10*3/uL (ref 0.1–1.0)
Monocytes Relative: 8.8 % (ref 3.0–12.0)
Neutro Abs: 4 10*3/uL (ref 1.4–7.7)
Neutrophils Relative %: 50.4 % (ref 43.0–77.0)
Platelets: 197 10*3/uL (ref 150.0–400.0)
RBC: 4.29 Mil/uL (ref 3.87–5.11)
RDW: 12.5 % (ref 11.5–15.5)
WBC: 7.9 10*3/uL (ref 4.0–10.5)

## 2021-12-07 LAB — URINE CULTURE
MICRO NUMBER:: 13018535
Result:: NO GROWTH
SPECIMEN QUALITY:: ADEQUATE

## 2021-12-07 MED ORDER — NYSTATIN 100000 UNIT/ML MT SUSP: 5.0000 mL | Freq: Four times a day (QID) | OROMUCOSAL | 0 refills | Status: AC

## 2021-12-10 NOTE — Telephone Encounter (Signed)
Spoke with pt and she stated that the medication has already been refilled.

## 2021-12-26 ENCOUNTER — Other Ambulatory Visit: Payer: Self-pay | Admitting: Internal Medicine

## 2021-12-26 DIAGNOSIS — Z1231 Encounter for screening mammogram for malignant neoplasm of breast: Secondary | ICD-10-CM

## 2021-12-31 ENCOUNTER — Ambulatory Visit
Admission: RE | Admit: 2021-12-31 | Discharge: 2021-12-31 | Disposition: A | Discharge status: Home / Self Care | Payer: BC Managed Care – PPO | Source: Ambulatory Visit | Attending: Internal Medicine | Admitting: Internal Medicine

## 2021-12-31 DIAGNOSIS — Z1231 Encounter for screening mammogram for malignant neoplasm of breast: Secondary | ICD-10-CM

## 2022-01-30 DIAGNOSIS — R070 Pain in throat: Secondary | ICD-10-CM | POA: Diagnosis not present

## 2022-01-30 DIAGNOSIS — J3501 Chronic tonsillitis: Secondary | ICD-10-CM | POA: Diagnosis not present

## 2022-11-06 ENCOUNTER — Ambulatory Visit: Payer: BC Managed Care – PPO | Admitting: Family Medicine

## 2022-11-06 ENCOUNTER — Encounter: Payer: Self-pay | Admitting: Family Medicine

## 2022-11-06 VITALS — BP 130/80 | HR 68 | Temp 98.6°F | Ht 66.0 in | Wt 228.6 lb

## 2022-11-06 DIAGNOSIS — J0100 Acute maxillary sinusitis, unspecified: Secondary | ICD-10-CM

## 2022-11-06 DIAGNOSIS — J329 Chronic sinusitis, unspecified: Secondary | ICD-10-CM | POA: Insufficient documentation

## 2022-11-06 MED ORDER — AMOXICILLIN-POT CLAVULANATE 875-125 MG PO TABS: 1.0000 | ORAL_TABLET | Freq: Two times a day (BID) | ORAL | 0 refills | Status: AC

## 2022-11-06 NOTE — Progress Notes (Signed)
  Tommi Rumps, MD Phone: [phone]  Kristin Davis is a 52 y.o. female who presents today for f/u.  Sinus congestion: Patient notes this has been going on for a little over a week.  She has maxillary sinus congestion and pressure.  Some frontal headache.  No fever or shortness of breath.  No postnasal drip.  Minimal sore throat.  No taste or smell disturbances.  She does note some left ear discomfort.  No flu or COVID exposure.  She has not taken a COVID test.  She reports she was also sick around Christmas though improved from that illness for about 2 weeks before this illness.  She declines a rapid COVID test today.  Social History   Tobacco Use  Smoking Status Never  Smokeless Tobacco Never    Current Outpatient Medications on File Prior to Visit  Medication Sig Dispense Refill   cholecalciferol (VITAMIN D3) 25 MCG (1000 UNIT) tablet Take 1,000 Units by mouth daily.     MAGNESIUM GLUCONATE PO Take 1 tablet by mouth daily.     Multiple Vitamin (MULTIVITAMIN) tablet Take 1 tablet by mouth daily.     [DISCONTINUED] RABEprazole (ACIPHEX) 20 MG tablet TAKE 1 TABLET (20 MG TOTAL) BY MOUTH ONCE DAILY TAKE 30 MINUTES BEFORE BREAKFAST     No current facility-administered medications on file prior to visit.     ROS see history of present illness  Objective  Physical Exam Vitals:   11/06/22 1330  BP: 130/80  Pulse: 68  Temp: 98.6 F (37 C)  SpO2: 98%    BP Readings from Last 3 Encounters:  11/06/22 130/80  11/20/21 118/70  11/15/21 (!) 140/93   Wt Readings from Last 3 Encounters:  11/06/22 228 lb 9.6 oz (103.7 kg)  11/20/21 226 lb 3.2 oz (102.6 kg)  11/15/21 220 lb (99.8 kg)    Physical Exam Constitutional:      General: She is not in acute distress.    Appearance: She is not diaphoretic.  HENT:     Head:     Comments: No maxillary or frontal sinus tenderness to percussion    Right Ear: Tympanic membrane normal.     Left Ear: Tympanic membrane normal.   Cardiovascular:     Rate and Rhythm: Normal rate and regular rhythm.     Heart sounds: Normal heart sounds.  Pulmonary:     Effort: Pulmonary effort is normal.     Breath sounds: Normal breath sounds.  Lymphadenopathy:     Cervical: No cervical adenopathy.  Skin:    General: Skin is warm and dry.  Neurological:     Mental Status: She is alert.      Assessment/Plan: Please see individual problem list.  Acute non-recurrent maxillary sinusitis Assessment & Plan: Patient likely has sinusitis given greater than 7 days duration of congestion and pressure in her maxillary sinuses.  Will treat with Augmentin 1 tablet twice daily for 7 days.  Discussed the risk of diarrhea with this and if she develops excessive diarrhea she will let us know.  If she is not improving within 3 to 4 days she will contact us.  Orders: -     Amoxicillin-Pot Clavulanate; Take 1 tablet by mouth 2 (two) times daily.  Dispense: 14 tablet; Refill: 0    Return if symptoms worsen or fail to improve.   Tommi Rumps, MD [place]

## 2022-11-06 NOTE — Assessment & Plan Note (Signed)
Patient likely has sinusitis given greater than 7 days duration of congestion and pressure in her maxillary sinuses.  Will treat with Augmentin 1 tablet twice daily for 7 days.  Discussed the risk of diarrhea with this and if she develops excessive diarrhea she will let us know.  If she is not improving within 3 to 4 days she will contact us.

## 2022-11-17 DIAGNOSIS — R300 Dysuria: Secondary | ICD-10-CM | POA: Diagnosis not present

## 2022-11-17 DIAGNOSIS — N390 Urinary tract infection, site not specified: Secondary | ICD-10-CM | POA: Diagnosis not present

## 2022-12-09 ENCOUNTER — Ambulatory Visit: Payer: BC Managed Care – PPO | Admitting: Dermatology

## 2023-02-10 ENCOUNTER — Other Ambulatory Visit: Payer: Self-pay | Admitting: Internal Medicine

## 2023-02-10 DIAGNOSIS — Z1231 Encounter for screening mammogram for malignant neoplasm of breast: Secondary | ICD-10-CM

## 2023-02-13 ENCOUNTER — Ambulatory Visit
Admission: RE | Admit: 2023-02-13 | Discharge: 2023-02-13 | Disposition: A | Discharge status: Home / Self Care | Payer: BC Managed Care – PPO | Source: Ambulatory Visit | Attending: Internal Medicine | Admitting: Internal Medicine

## 2023-02-13 DIAGNOSIS — Z1231 Encounter for screening mammogram for malignant neoplasm of breast: Secondary | ICD-10-CM | POA: Diagnosis not present

## 2023-03-20 DIAGNOSIS — E612 Magnesium deficiency: Secondary | ICD-10-CM | POA: Diagnosis not present

## 2023-03-20 DIAGNOSIS — R6882 Decreased libido: Secondary | ICD-10-CM | POA: Diagnosis not present

## 2023-03-20 DIAGNOSIS — N959 Unspecified menopausal and perimenopausal disorder: Secondary | ICD-10-CM | POA: Diagnosis not present

## 2023-03-20 DIAGNOSIS — E538 Deficiency of other specified B group vitamins: Secondary | ICD-10-CM | POA: Diagnosis not present

## 2023-03-20 DIAGNOSIS — E559 Vitamin D deficiency, unspecified: Secondary | ICD-10-CM | POA: Diagnosis not present

## 2023-03-20 DIAGNOSIS — Z1329 Encounter for screening for other suspected endocrine disorder: Secondary | ICD-10-CM | POA: Diagnosis not present

## 2023-03-20 DIAGNOSIS — R5383 Other fatigue: Secondary | ICD-10-CM | POA: Diagnosis not present

## 2023-03-20 DIAGNOSIS — Z131 Encounter for screening for diabetes mellitus: Secondary | ICD-10-CM | POA: Diagnosis not present

## 2023-04-28 DIAGNOSIS — R5383 Other fatigue: Secondary | ICD-10-CM | POA: Diagnosis not present

## 2023-04-28 DIAGNOSIS — R6882 Decreased libido: Secondary | ICD-10-CM | POA: Diagnosis not present

## 2023-04-28 DIAGNOSIS — N959 Unspecified menopausal and perimenopausal disorder: Secondary | ICD-10-CM | POA: Diagnosis not present

## 2023-04-28 DIAGNOSIS — E612 Magnesium deficiency: Secondary | ICD-10-CM | POA: Diagnosis not present

## 2023-07-21 DIAGNOSIS — N959 Unspecified menopausal and perimenopausal disorder: Secondary | ICD-10-CM | POA: Diagnosis not present

## 2023-07-21 DIAGNOSIS — R5383 Other fatigue: Secondary | ICD-10-CM | POA: Diagnosis not present

## 2023-07-21 DIAGNOSIS — E039 Hypothyroidism, unspecified: Secondary | ICD-10-CM | POA: Diagnosis not present

## 2023-07-21 DIAGNOSIS — E538 Deficiency of other specified B group vitamins: Secondary | ICD-10-CM | POA: Diagnosis not present

## 2023-07-21 DIAGNOSIS — Z131 Encounter for screening for diabetes mellitus: Secondary | ICD-10-CM | POA: Diagnosis not present

## 2023-07-21 DIAGNOSIS — Z1329 Encounter for screening for other suspected endocrine disorder: Secondary | ICD-10-CM | POA: Diagnosis not present

## 2023-07-21 DIAGNOSIS — E063 Autoimmune thyroiditis: Secondary | ICD-10-CM | POA: Diagnosis not present

## 2023-07-21 DIAGNOSIS — E559 Vitamin D deficiency, unspecified: Secondary | ICD-10-CM | POA: Diagnosis not present

## 2023-07-28 DIAGNOSIS — N959 Unspecified menopausal and perimenopausal disorder: Secondary | ICD-10-CM | POA: Diagnosis not present

## 2023-07-28 DIAGNOSIS — R6882 Decreased libido: Secondary | ICD-10-CM | POA: Diagnosis not present

## 2023-07-28 DIAGNOSIS — E612 Magnesium deficiency: Secondary | ICD-10-CM | POA: Diagnosis not present

## 2023-07-28 DIAGNOSIS — R5383 Other fatigue: Secondary | ICD-10-CM | POA: Diagnosis not present

## 2024-03-16 ENCOUNTER — Other Ambulatory Visit: Payer: Self-pay | Admitting: Internal Medicine

## 2024-03-16 DIAGNOSIS — Z1231 Encounter for screening mammogram for malignant neoplasm of breast: Secondary | ICD-10-CM

## 2024-03-19 ENCOUNTER — Ambulatory Visit
Admission: RE | Admit: 2024-03-19 | Discharge: 2024-03-19 | Disposition: A | Discharge status: Home / Self Care | Source: Ambulatory Visit | Attending: Internal Medicine | Admitting: Internal Medicine

## 2024-03-19 DIAGNOSIS — Z1231 Encounter for screening mammogram for malignant neoplasm of breast: Secondary | ICD-10-CM

## 2024-03-26 ENCOUNTER — Other Ambulatory Visit: Payer: Self-pay | Admitting: Internal Medicine

## 2024-03-26 DIAGNOSIS — R928 Other abnormal and inconclusive findings on diagnostic imaging of breast: Secondary | ICD-10-CM

## 2024-03-27 ENCOUNTER — Ambulatory Visit: Payer: Self-pay | Admitting: Internal Medicine

## 2024-04-09 ENCOUNTER — Ambulatory Visit
Admission: RE | Admit: 2024-04-09 | Discharge: 2024-04-09 | Disposition: A | Discharge status: Home / Self Care | Source: Ambulatory Visit | Attending: Internal Medicine | Admitting: Internal Medicine

## 2024-04-09 DIAGNOSIS — R928 Other abnormal and inconclusive findings on diagnostic imaging of breast: Secondary | ICD-10-CM
